# Patient Record
Sex: Male | Born: 1985 | Race: Black or African American | Hispanic: No | Marital: Married | Smoking: Current every day smoker
Health system: Southern US, Community
[De-identification: ages and names within clinical notes are randomized; demographics above are authoritative.]

## PROBLEM LIST (undated history)

## (undated) DIAGNOSIS — F419 Anxiety disorder, unspecified: Secondary | ICD-10-CM

## (undated) DIAGNOSIS — I10 Essential (primary) hypertension: Secondary | ICD-10-CM

## (undated) DIAGNOSIS — F32A Depression, unspecified: Secondary | ICD-10-CM

---

## 2005-06-07 ENCOUNTER — Ambulatory Visit: Payer: Self-pay | Admitting: Family Medicine

## 2005-06-09 ENCOUNTER — Ambulatory Visit: Payer: Self-pay | Admitting: Family Medicine

## 2005-06-15 ENCOUNTER — Emergency Department (HOSPITAL_COMMUNITY): Admission: EM | Admit: 2005-06-15 | Discharge: 2005-06-16 | Payer: Self-pay | Admitting: Emergency Medicine
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
## 2009-05-21 ENCOUNTER — Emergency Department (HOSPITAL_COMMUNITY): Admission: EM | Admit: 2009-05-21 | Discharge: 2009-05-21 | Payer: Self-pay | Admitting: Emergency Medicine

## 2018-12-02 ENCOUNTER — Other Ambulatory Visit: Payer: Self-pay

## 2018-12-02 DIAGNOSIS — Z20822 Contact with and (suspected) exposure to covid-19: Secondary | ICD-10-CM

## 2018-12-04 LAB — NOVEL CORONAVIRUS, NAA: SARS-CoV-2, NAA: DETECTED — AB

## 2019-09-22 ENCOUNTER — Encounter (HOSPITAL_COMMUNITY): Payer: Self-pay | Admitting: Emergency Medicine

## 2019-09-22 ENCOUNTER — Other Ambulatory Visit: Payer: Self-pay

## 2019-09-22 ENCOUNTER — Emergency Department (HOSPITAL_COMMUNITY)
Admission: EM | Admit: 2019-09-22 | Discharge: 2019-09-22 | Disposition: A | Discharge status: Home / Self Care | Payer: Self-pay | Attending: Emergency Medicine | Admitting: Emergency Medicine

## 2019-09-22 DIAGNOSIS — I10 Essential (primary) hypertension: Secondary | ICD-10-CM | POA: Insufficient documentation

## 2019-09-22 DIAGNOSIS — F17210 Nicotine dependence, cigarettes, uncomplicated: Secondary | ICD-10-CM | POA: Insufficient documentation

## 2019-09-22 DIAGNOSIS — F1910 Other psychoactive substance abuse, uncomplicated: Secondary | ICD-10-CM | POA: Insufficient documentation

## 2019-09-22 DIAGNOSIS — Z79899 Other long term (current) drug therapy: Secondary | ICD-10-CM | POA: Insufficient documentation

## 2019-09-22 HISTORY — DX: Essential (primary) hypertension: I10

## 2019-09-22 LAB — COMPREHENSIVE METABOLIC PANEL
ALT: 20 U/L (ref 0–44)
AST: 18 U/L (ref 15–41)
Albumin: 4 g/dL (ref 3.5–5.0)
Alkaline Phosphatase: 38 U/L (ref 38–126)
Anion gap: 9 (ref 5–15)
BUN: 13 mg/dL (ref 6–20)
CO2: 27 mmol/L (ref 22–32)
Calcium: 9.3 mg/dL (ref 8.9–10.3)
Chloride: 104 mmol/L (ref 98–111)
Creatinine, Ser: 1.31 mg/dL — ABNORMAL HIGH (ref 0.61–1.24)
GFR calc Af Amer: >60 mL/min (ref >60)
GFR calc non Af Amer: >60 mL/min (ref >60)
Glucose, Bld: 124 mg/dL — ABNORMAL HIGH (ref 70–99)
Potassium: 3.2 mmol/L — ABNORMAL LOW (ref 3.5–5.1)
Sodium: 140 mmol/L (ref 135–145)
Total Bilirubin: 0.8 mg/dL (ref 0.3–1.2)
Total Protein: 7.2 g/dL (ref 6.5–8.1)

## 2019-09-22 LAB — CBC WITH DIFFERENTIAL/PLATELET
Abs Immature Granulocytes: 0.02 10*3/uL (ref 0.00–0.07)
Basophils Absolute: 0 10*3/uL (ref 0.0–0.1)
Basophils Relative: 0 %
Eosinophils Absolute: 0.4 10*3/uL (ref 0.0–0.5)
Eosinophils Relative: 4 %
HCT: 46.1 % (ref 39.0–52.0)
Hemoglobin: 14.9 g/dL (ref 13.0–17.0)
Immature Granulocytes: 0 %
Lymphocytes Relative: 31 %
Lymphs Abs: 2.6 10*3/uL (ref 0.7–4.0)
MCH: 31.5 pg (ref 26.0–34.0)
MCHC: 32.3 g/dL (ref 30.0–36.0)
MCV: 97.5 fL (ref 80.0–100.0)
Monocytes Absolute: 0.6 10*3/uL (ref 0.1–1.0)
Monocytes Relative: 8 %
Neutro Abs: 4.7 10*3/uL (ref 1.7–7.7)
Neutrophils Relative %: 57 %
Platelets: 228 10*3/uL (ref 150–400)
RBC: 4.73 MIL/uL (ref 4.22–5.81)
RDW: 11.8 % (ref 11.5–15.5)
WBC: 8.3 10*3/uL (ref 4.0–10.5)
nRBC: 0 % (ref 0.0–0.2)

## 2019-09-22 LAB — RAPID URINE DRUG SCREEN, HOSP PERFORMED
Amphetamines: POSITIVE — AB
Barbiturates: NOT DETECTED
Benzodiazepines: NOT DETECTED
Cocaine: POSITIVE — AB
Opiates: NOT DETECTED
Tetrahydrocannabinol: POSITIVE — AB

## 2019-09-22 LAB — LIPASE, BLOOD: Lipase: 56 U/L — ABNORMAL HIGH (ref 11–51)

## 2019-09-22 NOTE — ED Triage Notes (Signed)
Pt brought in y [employer] for detox from marijuana, cocaine and ectasy last use last night/early this morning.

## 2019-09-22 NOTE — Discharge Instructions (Addendum)
Recommend refraining from illicit substances that you currently are using.  If you develop nausea, vomiting, chest pain, or other new concerns symptom, recommend return to ER for reassessment.  If you have thoughts of hurting herself, hurting others, would additionally recommend return to ER for reassessment.

## 2019-09-22 NOTE — ED Provider Notes (Signed)
[facility]-EMERGENCY DEPT Provider Note   CSN: [id number] Arrival date & time: 09/22/19  1520     History Chief Complaint  Patient presents with  . detox    Lamontae Ricardo is a 34 y.o. male.  Presents to ER by request of his girlfriend for evaluation for assistance with detox.  Patient primarily uses cocaine, also has used marijuana and ecstasy.  Has been on cocaine bender over the past couple days, has very poor sleep.  Today he feels very sleepy, fatigued.  On initial assessment, patient denies any abdominal pain, nausea or vomiting.  Felt very hungry and ate large [employer] cheeseburger.  On reassessment, patient states that he is having a stomachache and felt mildly nauseous.  No vomiting.  Denies any SI, HI, AVH.  He is not interested in going through detox program, he is, desires to go home and sleep.  HPI     Past Medical History:  Diagnosis Date  . Hypertension     There are no problems to display for this patient.   History reviewed. No pertinent surgical history.     No family history on file.  Social History   Tobacco Use  . Smoking status: Current Every Day Smoker    Types: Cigarettes  . Smokeless tobacco: Never Used  Substance Use Topics  . Alcohol use: Not on file  . Drug use: Yes    Types: Marijuana, Cocaine    Comment: ectasy     Home Medications Prior to Admission medications   Not on File    Allergies    Patient has no known allergies.  Review of Systems   Review of Systems  Constitutional: Negative for chills and fever.  HENT: Negative for ear pain and sore throat.   Eyes: Negative for pain and visual disturbance.  Respiratory: Negative for cough and shortness of breath.   Cardiovascular: Negative for chest pain and palpitations.  Gastrointestinal: Positive for nausea. Negative for abdominal pain and vomiting.  Genitourinary: Negative for dysuria and hematuria.  Musculoskeletal: Negative for arthralgias and back  pain.  Skin: Negative for color change and rash.  Neurological: Negative for seizures and syncope.  Psychiatric/Behavioral: Positive for sleep disturbance. Negative for self-injury. The patient is not nervous/anxious.   All other systems reviewed and are negative.   Physical Exam Updated Vital Signs BP (!) 156/104 (BP Location: Right Arm)   Pulse 83   Temp 98.8 F (37.1 C) (Oral)   Resp 16   SpO2 100%   Physical Exam Vitals and nursing note reviewed.  Constitutional:      Appearance: He is well-developed.     Comments: Mildly sleepy but not lethargic he is readily alert and answers questions appropriately  HENT:     Head: Normocephalic and atraumatic.  Eyes:     Conjunctiva/sclera: Conjunctivae normal.  Cardiovascular:     Rate and Rhythm: Normal rate and regular rhythm.     Heart sounds: No murmur.  Pulmonary:     Effort: Pulmonary effort is normal. No respiratory distress.     Breath sounds: Normal breath sounds.  Abdominal:     Palpations: Abdomen is soft.     Tenderness: There is no abdominal tenderness.  Musculoskeletal:        General: No deformity or signs of injury.     Cervical back: Neck supple.  Skin:    General: Skin is warm and dry.  Neurological:     General: No focal deficit present.  Mental Status: He is alert. Mental status is at baseline.  Psychiatric:     Comments: No SI or HI     ED Results / Procedures / Treatments   Labs (all labs ordered are listed, but only abnormal results are displayed) Labs Reviewed  RAPID URINE DRUG SCREEN, HOSP PERFORMED - Abnormal; Notable for the following components:      Result Value   Cocaine POSITIVE (*)    Amphetamines POSITIVE (*)    Tetrahydrocannabinol POSITIVE (*)    All other components within normal limits  COMPREHENSIVE METABOLIC PANEL - Abnormal; Notable for the following components:   Potassium 3.2 (*)    Glucose, Bld 124 (*)    Creatinine, Ser 1.31 (*)    All other components within normal  limits  LIPASE, BLOOD - Abnormal; Notable for the following components:   Lipase 56 (*)    All other components within normal limits  CBC WITH DIFFERENTIAL/PLATELET    EKG None  Radiology No results found.  Procedures Procedures (including critical care time)  Medications Ordered in ED Medications - No data to display  ED Course  I have reviewed the triage vital signs and the nursing notes.  Pertinent labs & imaging results that were available during my care of the patient were reviewed by me and considered in my medical decision making (see chart for details).    MDM Rules/Calculators/A&P                      34 year old male came to ER at request of his girlfriend after he went on a recent cocaine bender.  In ER today, patient appears well, mildly sleepy but not lethargic.  His vital signs are stable..  Patient initially denied any GI symptoms and was eating [employer], later he reported stomach ache. On re-exam, abdomen remained soft without tenderness. Got basic labs - mild bump in Cr, slight hypoK, suspect dehydration, normal LFTs, no WBC elevation. Pt tolerating PO without difficulty.  Initially, felt like patient was interested in seeking help for detox however when further pressed, patient reports he does not have any interest in this going to any sort of detox or rehab program for his cocaine abuse.  He only desires to go home to catch up on sleep.  Given patient is demonstrating any SI or HI, hemodynamically stable with benign exam, will discharge home with his girlfriend.    After the discussed management above, the patient was determined to be safe for discharge.  The patient was in agreement with this plan and all questions regarding their care were answered.  ED return precautions were discussed and the patient will return to the ED with any significant worsening of condition.    Final Clinical Impression(s) / ED Diagnoses Final diagnoses:  Polysubstance abuse [facility])     Rx / DC Orders ED Discharge Orders    None       Lucrezia Starch, MD 09/23/19 1551

## 2019-09-22 NOTE — Patient Outreach (Signed)
[facility] met with Pt an made attempt to process with Pt to gain an understanding on what services Pt is seeking, [facility] was made aware that he wants help but he wants to sleep right now. Pt addressed the fact that he has been up for 4 days partying an using. Pt mentioned that he just needs to lay down for a few days. [facility]PSS left contact information so that that Pt can follow up in community when ready for change.

## 2020-08-23 ENCOUNTER — Ambulatory Visit
Admission: EM | Admit: 2020-08-23 | Discharge: 2020-08-23 | Disposition: A | Discharge status: Home / Self Care | Payer: Self-pay | Attending: Family Medicine | Admitting: Family Medicine

## 2020-08-23 ENCOUNTER — Other Ambulatory Visit: Payer: Self-pay

## 2020-08-23 DIAGNOSIS — Z202 Contact with and (suspected) exposure to infections with a predominantly sexual mode of transmission: Secondary | ICD-10-CM | POA: Insufficient documentation

## 2020-08-23 MED ORDER — METRONIDAZOLE 500 MG PO TABS: 2000.0000 mg | ORAL_TABLET | Freq: Once | ORAL | 0 refills | Status: AC

## 2020-08-23 NOTE — ED Triage Notes (Signed)
Pt states his partner tested positive for trich

## 2020-08-26 LAB — CYTOLOGY, (ORAL, ANAL, URETHRAL) ANCILLARY ONLY
Chlamydia: NEGATIVE
Comment: NEGATIVE
Comment: NEGATIVE
Comment: NORMAL
Neisseria Gonorrhea: NEGATIVE
Trichomonas: NEGATIVE

## 2020-09-12 ENCOUNTER — Other Ambulatory Visit: Payer: Self-pay

## 2020-09-12 ENCOUNTER — Ambulatory Visit (HOSPITAL_COMMUNITY)
Admission: EM | Admit: 2020-09-12 | Discharge: 2020-09-13 | Disposition: A | Discharge status: Other Acute Inpatient Hospital | Payer: No Payment, Other | Attending: Urology | Admitting: Urology

## 2020-09-12 DIAGNOSIS — F1910 Other psychoactive substance abuse, uncomplicated: Secondary | ICD-10-CM | POA: Insufficient documentation

## 2020-09-12 DIAGNOSIS — F332 Major depressive disorder, recurrent severe without psychotic features: Secondary | ICD-10-CM | POA: Insufficient documentation

## 2020-09-12 DIAGNOSIS — Z9151 Personal history of suicidal behavior: Secondary | ICD-10-CM | POA: Insufficient documentation

## 2020-09-12 DIAGNOSIS — Z20822 Contact with and (suspected) exposure to covid-19: Secondary | ICD-10-CM | POA: Diagnosis not present

## 2020-09-12 DIAGNOSIS — R45851 Suicidal ideations: Secondary | ICD-10-CM | POA: Diagnosis not present

## 2020-09-12 DIAGNOSIS — F322 Major depressive disorder, single episode, severe without psychotic features: Secondary | ICD-10-CM

## 2020-09-12 LAB — RESP PANEL BY RT-PCR (FLU A&B, COVID) ARPGX2
Influenza A by PCR: NEGATIVE
Influenza B by PCR: NEGATIVE
SARS Coronavirus 2 by RT PCR: NEGATIVE

## 2020-09-12 LAB — POCT URINE DRUG SCREEN - MANUAL ENTRY (I-SCREEN)
POC Amphetamine UR: NOT DETECTED
POC Buprenorphine (BUP): NOT DETECTED
POC Cocaine UR: POSITIVE — AB
POC Marijuana UR: POSITIVE — AB
POC Methadone UR: NOT DETECTED
POC Methamphetamine UR: NOT DETECTED
POC Morphine: NOT DETECTED
POC Oxazepam (BZO): NOT DETECTED
POC Oxycodone UR: NOT DETECTED
POC Secobarbital (BAR): NOT DETECTED

## 2020-09-12 LAB — POC SARS CORONAVIRUS 2 AG: SARSCOV2ONAVIRUS 2 AG: NEGATIVE

## 2020-09-12 MED ORDER — HYDROXYZINE HCL 25 MG PO TABS: 25.0000 mg | ORAL_TABLET | Freq: Three times a day (TID) | ORAL | Status: DC | PRN

## 2020-09-12 MED ORDER — ACETAMINOPHEN 325 MG PO TABS: 650.0000 mg | ORAL_TABLET | Freq: Four times a day (QID) | ORAL | Status: DC | PRN

## 2020-09-12 MED ORDER — MAGNESIUM HYDROXIDE 400 MG/5ML PO SUSP: 30.0000 mL | Freq: Every day | ORAL | Status: DC | PRN

## 2020-09-12 MED ORDER — ALUM & MAG HYDROXIDE-SIMETH 200-200-20 MG/5ML PO SUSP: 30.0000 mL | ORAL | Status: DC | PRN

## 2020-09-12 MED ORDER — TRAZODONE HCL 50 MG PO TABS: 50.0000 mg | ORAL_TABLET | Freq: Every evening | ORAL | Status: DC | PRN

## 2020-09-12 NOTE — ED Notes (Addendum)
Pt admitted to continuous assessment due to SI. IVC paperwork in place states, "[name] tried to hang himself on a tree while live streaming on Facebook live. [name] abuses crack cocaine." Pt A&O x4, calm and cooperative. Pt tolerated lab work and skin assessment well. RN unable to obtain blood samples and pt requested to not try again tonight. Cecilio Asper, NP made aware. Pt ambulated independently to unit. Oriented to unit/staff. Meal given. No signs of acute distress noted. Will continue to monitor for safety.

## 2020-09-12 NOTE — ED Notes (Signed)
Pt asleep in bed. Respirations even and unlabored. Will continue to monitor for safety. ?

## 2020-09-12 NOTE — BH Assessment (Addendum)
Comprehensive Clinical Assessment (CCA) Note  09/12/2020 Julian Clarke [id number]  Recommendations for Services/Supports/Treatments: Cecilio Asper, NP, reviewed pt's chart and information and the IVC paperwork and determined pt meets inpatient criteria.  The patient demonstrates the following risk factors for suicide: Chronic risk factors for suicide include: psychiatric disorder of MDD, substance use disorder and demographic factors (male, >35 y/o). Acute risk factors for suicide include: family or marital conflict and loss (financial, interpersonal, professional). Protective factors for this patient include: positive social support and responsibility to others (children, family). Considering these factors, the overall suicide risk at this point appears to be high. Patient is not appropriate for outpatient follow up.  Therefore, a 1:1 sitter is recommended for suicide prevention.  Flowsheet Row ED from 09/12/2020 in [facility] ED from 08/23/2020 in [facility]   C-SSRS RISK CATEGORY High Risk No Risk     Chief Complaint:  Chief Complaint  Patient presents with  . Urgent Emergent Eval IVC   Visit Diagnosis: F33.2, Major depressive disorder, Recurrent episode, Severe; F14.24, Cocaine-induced depressive disorder, With moderate use disorder  CCA Screening, Triage and Referral (STR) Julian Clarke is a 36 year old patient who was brought to the [facility] by [facility] due to pt getting on live social media and saying "good-byes," as he was going to hang himself. After pt was brought to the [facility], he was [facility] by the police. The IVC states:  "[name] tried to hang himself on a tree while live-streaming it on [employer]. [name] abuses crack cocaine."  Pt acknowledges he attempted to kill himself this evening, stating he has been using crack-cocaine for "3 days non-stop." Pt states he's had sobriety in the past  and was 3 months sober until several weeks ago when he relapsed. Pt states he uses crack-cocaine, Ecstacy, marijuana, and EtOH.  Pt acknowledges current and prior SI; he denies he has a plan to kill himself at this time but acknowledges he attempted to kill himself today and in the past. Pt states he's been hospitalized in the past for mental health concerns but no such hospitalizations could be found in pt's record. Pt denies HI, AVH (with the exception of when he's using drugs), NSSIB, access to guns/weapons. He states he has upcoming court dates for vehicular charges, such as driving without a license, driving under a suspended license, etc.  Pt shares he is under a lot of stress at this time, including having a custody battle and having one of his children and his best friend die within several days of each other in April 2019. Pt shares he's also lost a younger sister to cancer and that his father is deceased. Pt shares he has a good relationship with his maternal grandparents and that he speaks to them daily. Pt states he also has a good relationship with his mother and that he speaks to her daily; pt shares his mother has a hx of SA and has been to treatment in the past.  Pt is oriented x5. His recent/remote memory is intact. Pt was cooperative throughout the assessment process. Pt's insight, judgement, and impulse control is impaired at this time.   Patient Reported Information How did you hear about [place]? Legal System  Referral name: LEO  Referral phone number: 0 (N/A)   Whom do you see for routine medical problems? No data recorded Practice/Facility Name: No data recorded Practice/Facility Phone Number: No data recorded Name of Contact: No data recorded  Contact Number: No data recorded Contact Fax Number: No data recorded Prescriber Name: No data recorded Prescriber Address (if known): No data recorded  What Is the Reason for Your Visit/Call Today? Pt was IVCed by the police after  he attempted to hang himself from a tree. Pt was attempting to kill himself on FB Live.  How Long Has This Been Causing You Problems? > than 6 months  What Do You Feel Would Help You the Most Today? Alcohol or Drug Use Treatment; Treatment for Depression or other mood problem   Have You Recently Been in Any Inpatient Treatment (Hospital/Detox/Crisis Center/28-Day Program)? No  Name/Location of Program/Hospital:No data recorded How Long Were You There? No data recorded When Were You Discharged? No data recorded  Have You Ever Received Services From [facility] Before? Yes  Who [facility]? Pt has been seen in the ED/Urgent Care several times   Have You Recently Had Any Thoughts About Hurting Yourself? Yes  Are You Planning to Commit Suicide/Harm Yourself At This time? No (However, pt was attempting to kill himself prior to [facility])   Have you Recently Had Thoughts About Hurting Someone Karolee Ohslse? No  Explanation: No data recorded  Have You Used Any Alcohol or Drugs in the Past 24 Hours? Yes  How Long Ago Did You Use Drugs or Alcohol? No data recorded What Did You Use and How Much? Pt was using crack-cocaine non-stop for the last 3 days.   Do You Currently Have a Therapist/Psychiatrist? No  Name of Therapist/Psychiatrist: No data recorded  Have You Been Recently Discharged From Any Office Practice or Programs? No  Explanation of Discharge From Practice/Program: No data recorded    CCA Screening Triage Referral Assessment Type of Contact: Face-to-Face  Is this Initial or Reassessment? No data recorded Date Telepsych consult ordered in [facility]:  No data recorded Time Telepsych consult ordered in [facility]:  No data recorded  Patient Reported Information Reviewed? Yes  Patient Left Without Being Seen? No data recorded Reason for Not Completing Assessment: No data recorded  Collateral Involvement: Pt declined to provide verbal consent at this time   Does  Patient Have a Court Appointed Legal Guardian? No data recorded Name and Contact of Legal Guardian: No data recorded If Minor and Not Living with Parent(s), Who has Custody? N/A  Is CPS involved or ever been involved? Never  Is APS involved or ever been involved? Never   Patient Determined To Be At Risk for Harm To Self or Others Based on Review of Patient Reported Information or Presenting Complaint? Yes, for Self-Harm  Method: No data recorded Availability of Means: No data recorded Intent: No data recorded Notification Required: No data recorded Additional Information for Danger to Others Potential: No data recorded Additional Comments for Danger to Others Potential: No data recorded Are There Guns or Other Weapons in Your Home? No data recorded Types of Guns/Weapons: No data recorded Are These Weapons Safely Secured?                            No data recorded Who Could Verify You Are Able To Have These Secured: No data recorded Do You Have any Outstanding Charges, Pending Court Dates, Parole/Probation? No data recorded Contacted To Inform of Risk of Harm To Self or Others: Patent examinerLaw Enforcement; Family/Significant Other: (Pt's family/friends and the police are aware of pt's potential harm to self.)   Location of Assessment:  [facility]   Does Patient Present under Involuntary Commitment? Yes  IVC Papers Initial File Date: 09/12/2020   [place] of Residence: [place]   Patient Currently Receiving the Following Services: Not Receiving Services   Determination of Need: Emergent (2 hours)   Options For Referral: Inpatient Hospitalization; Medication Management; Outpatient Therapy     CCA Biopsychosocial Intake/Chief Complaint:  Pt was IVCed by the police after he attempted to hang himself from a tree. Pt was attempting to kill himself on FB Live.  Current Symptoms/Problems: Pt has been using crack-cocaine and was experiencing SI with a plan   Patient  Reported Schizophrenia/Schizoaffective Diagnosis in Past: No   Strengths: Not assessed  Preferences: Not assessed  Abilities: Not assessed   Type of Services Patient Feels are Needed: Pt believes he most likely needs inpatient treatment   Initial Clinical Notes/Concerns: N/A   Mental Health Symptoms Depression:  Hopelessness; Difficulty Concentrating; Change in energy/activity   Duration of Depressive symptoms: Greater than two weeks   Mania:  None   Anxiety:   Worrying   Psychosis:  None   Duration of Psychotic symptoms: No data recorded  Trauma:  None   Obsessions:  None   Compulsions:  None   Inattention:  None   Hyperactivity/Impulsivity:  N/A   Oppositional/Defiant Behaviors:  None   Emotional Irregularity:  Potentially harmful impulsivity; Recurrent suicidal behaviors/gestures/threats   Other Mood/Personality Symptoms:  None noted    Mental Status Exam Appearance and self-care  Stature:  Average   Weight:  Overweight   Clothing:  Casual   Grooming:  Normal   Cosmetic use:  None   Posture/gait:  Normal   Motor activity:  Not Remarkable   Sensorium  Attention:  Normal   Concentration:  Normal   Orientation:  X5   Recall/memory:  Normal   Affect and Mood  Affect:  Appropriate   Mood:  Depressed   Relating  Eye contact:  Normal   Facial expression:  Responsive   Attitude toward examiner:  Cooperative   Thought and Language  Speech flow: Clear and Coherent   Thought content:  Appropriate to Mood and Circumstances   Preoccupation:  None   Hallucinations:  None (Pt states he only experiences AVH when he is using drugs)   Organization:  No data recorded  [employer] of Knowledge:  Average   Intelligence:  Average   Abstraction:  Normal   Judgement:  Poor   Reality Testing:  Realistic   Insight:  Poor   Decision Making:  Impulsive   Social Functioning  Social Maturity:  Impulsive   Social  Judgement:  Naive   Stress  Stressors:  Family conflict   Coping Ability:  Exhausted; Overwhelmed   Skill Deficits:  Decision making; Interpersonal; Self-control   Supports:  Family; Friends/Service system     Religion: Religion/Spirituality Are You A Religious Person?:  (Not assessed) How Might This Affect Treatment?: Not assessed  Leisure/Recreation: Leisure / Recreation Do You Have Hobbies?:  (Not assessed)  Exercise/Diet: Exercise/Diet Do You Exercise?:  (Not assessed) Have You Gained or Lost A Significant Amount of Weight in the Past Six Months?:  (Not assessed) Do You Follow a Special Diet?:  (Not assessed) Do You Have Any Trouble Sleeping?:  (Not assessed)   CCA Employment/Education Employment/Work Situation: Employment / Work Situation Employment situation: Employed Where is patient currently employed?: [employer] How long has patient been employed?: Not assessed Patient's job has been impacted by current illness:  (  Not assessed) What is the longest time patient has a held a job?: Not assessed Where was the patient employed at that time?: Not assessed Has patient ever been in the [employer]?:  (Not assessed)  Education: Education Is Patient Currently Attending School?: No Last Grade Completed:  (Not assessed) Name of High School: Not assessed Did Garment/textile technologist From [employer]?:  (Not assessed) Did You Attend College?:  (Not assessed) Did You Attend Graduate School?:  (Not assessed) Did You Have Any Special Interests In School?: Not assessed Did You Have An Individualized Education Program (IIEP):  (Not assessed) Did You Have Any Difficulty At School?:  (Not assessed) Patient's Education Has Been Impacted by Current Illness:  (Not assessed)   CCA Family/Childhood History Family and Relationship History: Family history Marital status:  (Not assessed) Are you sexually active?:  (Not assessed) What is your sexual orientation?: Not assessed Has your sexual  activity been affected by drugs, alcohol, medication, or emotional stress?: Not assessed Does patient have children?: Yes How many children?: 7 How is patient's relationship with their children?: Pt has 7 children with 5 different women; his involvement with each of them is different. One child died at age 43 (in 2017/08/27) after being hit by a car.  Childhood History:  Childhood History By whom was/is the patient raised?: Grandparents Additional childhood history information: Pt's mother has a hx of SA Description of patient's relationship with caregiver when they were a child: Not assessed Patient's description of current relationship with people who raised him/her: Pt has a strong relationship with his mother and his maternal grandparents. How were you disciplined when you got in trouble as a child/adolescent?: Not assessed Does patient have siblings?: Yes Number of Siblings: 3 Description of patient's current relationship with siblings: 1 sister (younger) died from cancer; 1 sister (older) he doesn't speak to; his brother (older) experienced a loss of oxygen and is special needs. Did patient suffer any verbal/emotional/physical/sexual abuse as a child?: No Did patient suffer from severe childhood neglect?: No Has patient ever been sexually abused/assaulted/raped as an adolescent or adult?: No Was the patient ever a victim of a crime or a disaster?: No Witnessed domestic violence?: No Has patient been affected by domestic violence as an adult?: No  Child/Adolescent Assessment:     CCA Substance Use Alcohol/Drug Use: Alcohol / Drug Use Pain Medications: See MAR Prescriptions: See MAR Over the Counter: See MAR History of alcohol / drug use?: Yes Longest period of sobriety (when/how long): Pt was 3 months sober until several months ago Negative Consequences of Use: Financial,Personal relationships Withdrawal Symptoms:  (Pt denies) Substance #1 Name of Substance 1: Crack  cocaine 1 - Age of First Use: 3 years ago 1 - Amount (size/oz): Unknown; pt states he smoked for 3 days non-stop 1 - Frequency: Varies 1 - Duration: Varies 1 - Last Use / Amount: 12 hours ago 1 - Method of Aquiring: Purchase 1- Route of Use: Smoke Substance #2 Name of Substance 2: Ecstacy 2 - Age of First Use: 23 2 - Amount (size/oz): 5 pills simultaneously 2 - Frequency: Occasionally 2 - Duration: N/A 2 - Last Use / Amount: 1 week ago 2 - Method of Aquiring: Purchase 2 - Route of Substance Use: Oral Substance #3 Name of Substance 3: Marijuana 3 - Age of First Use: 19 3 - Amount (size/oz): 3 1/2 grams 3 - Frequency: Every-other week 3 - Duration: N/A 3 - Last Use / Amount: Friday, August 11, 2020 3 -  Method of Aquiring: Purchase 3 - Route of Substance Use: Smoke Substance #4 Name of Substance 4: EtOH 4 - Age of First Use: 17 4 - Amount (size/oz): A "bootlegger" 4 - Frequency: Occasional 4 - Duration: N/A 4 - Last Use / Amount: Today 4 - Method of Aquiring: Purchase 4 - Route of Substance Use: Oral                 ASAM's:  Six Dimensions of Multidimensional Assessment  Dimension 1:  Acute Intoxication and/or Withdrawal Potential:   Dimension 1:  Description of individual's past and current experiences of substance use and withdrawal: Pt denies w/d symptoms  Dimension 2:  Biomedical Conditions and Complications:   Dimension 2:  Description of patient's biomedical conditions and  complications: Pt denies medical complications  Dimension 3:  Emotional, Behavioral, or Cognitive Conditions and Complications:  Dimension 3:  Description of emotional, behavioral, or cognitive conditions and complications: Pt attempted to kill himself today  Dimension 4:  Readiness to Change:  Dimension 4:  Description of Readiness to Change criteria: Pt acknowledges he needs to stop using substances  Dimension 5:  Relapse, Continued use, or Continued Problem Potential:  Dimension 5:  Relapse,  continued use, or continued problem potential critiera description: Pt has a hx of relapse  Dimension 6:  Recovery/Living Environment:  Dimension 6:  Recovery/Iiving environment criteria description: Pt lives alone  ASAM Severity Score: ASAM's Severity Rating Score: 12  ASAM Recommended Level of Treatment: ASAM Recommended Level of Treatment: Level II Intensive Outpatient Treatment   Substance use Disorder (SUD) Substance Use Disorder (SUD)  Checklist Symptoms of Substance Use: Persistent desire or unsuccessful efforts to cut down or control use,Substance(s) often taken in larger amounts or over longer times than was intended,Evidence of tolerance  Recommendations for Services/Supports/Treatments: Recommendations for Services/Supports/Treatments Recommendations For Services/Supports/Treatments: Inpatient Hospitalization,Individual Therapy,Medication Management  Cecilio Asper, NP, reviewed pt's chart and information and the IVC paperwork and determined pt meets inpatient criteria.  DSM5 Diagnoses: F33.2, Major depressive disorder, Recurrent episode, Severe; F14.24, Cocaine-induced depressive disorder, With moderate use disorder  Patient Centered Plan: Patient is on the following Treatment Plan(s):  Depression and Substance Abuse   Referrals to Alternative Service(s): Referred to Alternative Service(s):   Place:   Date:   Time:    Referred to Alternative Service(s):   Place:   Date:   Time:    Referred to Alternative Service(s):   Place:   Date:   Time:    Referred to Alternative Service(s):   Place:   Date:   Time:     Ralph Dowdy, LMFT

## 2020-09-12 NOTE — ED Provider Notes (Signed)
Behavioral Health Admission [facility]&P [facility] & OBS)  Date: 09/13/20 Patient Name: Julian Clarke MRN: [id number] Chief Complaint:  Chief Complaint  Patient presents with  . IVC  . Suicidal   Chief Complaint/Presenting Problem: Pt was IVCed by the police after he attempted to hang himself from a tree. Pt was attempting to kill himself on FB Live.  Diagnoses:  Final diagnoses:  Polysubstance abuse (HCC)  Suicidal ideation  Current severe episode of major depressive disorder without psychotic features, unspecified whether recurrent (HCC)    VZD:GLOVFIEP Julian Clarke is a 35 y/o male. Patient presented to [facility] involuntarily via law enforcement under IVC paper. Per IVC paper, patient was on facebook live stream stating he wanted to kill himself.   Patient presented with chief complaint of suicidal attempt. Patient is assessed by this NP. Patient is alert and oriented X4, calm and cooperative, he answers assessment questions appropriately, his mood is depressed and affect is congruent with his mood. He denies acute illness, respiratory distress, chest pain/discomfort, GU/GI symptoms, or recent trauma   Patient expressed hopelessness and feeling down. He states "I want to quit life. I'm done struggling to stay sober." patient endorses using crack cocaine, ecstasy, cocaine, and marijuana. He report that he was sober for 3 months and relapsed a few weeks ago. He states "I've been binging on crack cocaine non-stop for 3 days."   He reports a prior suicidal attempt last year by "drinking a bottle of Nyquil." He report that he felt sick afterwards and vomited the medication, he did not seek medication attention for this suicide attempt. He report prior inpatient psychiatric treatment at [facility] however on record of this admission was formed in Epic. Per chart review, he was evaluated for polysubstance abuse on 09/22/19 and discharged home because "he was not interested in going through a detox program."  Patient report  that he was suicidal earlier today; he states "I made a noose and hung it a tree in the backyard and attempted to hang myself today." He denies current suicidal ideation, he is also denying HI, paranoia, AVH (states he only has have hallucination when using drugs), and no delusions noted.   He reports that his main stressors are custody issues for his children, struggling with sobriety, and legal matters with his driving records. He is employed and works as a Conservation officer, nature at [employer], he lives at home alone. He has regular contact with his mother and maternal grandparents     PHQ 2-9:   Flowsheet Row ED from 09/12/2020 in [facility] ED from 08/23/2020 in [facility]   C-SSRS RISK CATEGORY High Risk No Risk       Total Time spent with patient: 30 minutes  Musculoskeletal  Strength & Muscle Tone: within normal limits Gait & Station: normal Patient leans: Right  Psychiatric Specialty Exam  Presentation General Appearance: Appropriate for Environment  Eye Contact:Good  Speech:Clear and Coherent  Speech Volume:Normal  Handedness:Right   Mood and Affect  Mood:Depressed  Affect:Congruent; Depressed   Thought Process  Thought Processes:Coherent  Descriptions of Associations:Intact  Orientation:Full (Time, Place and Person)  Thought Content:WDL    Hallucinations:Hallucinations: None  Ideas of Reference:None  Suicidal Thoughts:Suicidal Thoughts: Yes, Active SI Active Intent and/or Plan: With Plan; With Intent  Homicidal Thoughts:Homicidal Thoughts: No   Sensorium  Memory:Immediate Good; Recent Good; Remote Good  Judgment:Fair  Insight:Good   Executive Functions  Concentration:Good  Attention Span:Good  Recall:Good  Fund of Knowledge:Good  Language:Good  Psychomotor Activity  Psychomotor Activity:Psychomotor Activity: Normal   Assets  Assets:Communication Skills; Desire for Improvement; Housing;  Social Support; Physical Health; Vocational/Educational   Sleep  Sleep:Sleep: Fair   Nutritional Assessment (For [facility] only) Has the patient had a weight loss or gain of 10 pounds or more in the last 3 months?: No Has the patient had a decrease in food intake/or appetite?: No Does the patient have dental problems?: No Does the patient have eating habits or behaviors that may be indicators of an eating disorder including binging or inducing vomiting?: No Has the patient recently lost weight without trying?: No Has the patient been eating poorly because of a decreased appetite?: No Malnutrition Screening Tool Score: 0    Physical Exam Vitals and nursing note reviewed.  Constitutional:      Appearance: He is well-developed.  HENT:     Head: Normocephalic and atraumatic.  Eyes:     Conjunctiva/sclera: Conjunctivae normal.  Cardiovascular:     Rate and Rhythm: Normal rate.     Heart sounds: No murmur heard.   Pulmonary:     Effort: Pulmonary effort is normal. No respiratory distress.  Abdominal:     Tenderness: There is no abdominal tenderness.  Musculoskeletal:     Cervical back: Neck supple.  Skin:    General: Skin is warm and dry.  Neurological:     Mental Status: He is alert and oriented to person, place, and time.  Psychiatric:        Attention and Perception: Attention normal. He is attentive. He does not perceive auditory or visual hallucinations.        Mood and Affect: Mood normal.        Speech: Speech normal.        Behavior: Behavior normal. Behavior is cooperative.        Thought Content: Thought content is not paranoid or delusional. Thought content does not include homicidal or suicidal ideation. Thought content does not include homicidal or suicidal plan.        Cognition and Memory: Cognition normal.        Judgment: Judgment normal.    Review of Systems  Constitutional: Negative.  Negative for chills and fever.  HENT: Negative.    Eyes: Negative.   Respiratory: Negative.   Cardiovascular: Negative.  Negative for chest pain and palpitations.  Gastrointestinal: Negative.  Negative for abdominal pain, nausea and vomiting.  Genitourinary: Negative.   Musculoskeletal: Negative.   Skin: Negative.   Neurological: Negative.   Endo/Heme/Allergies: Negative.   Psychiatric/Behavioral: Positive for depression and substance abuse. Negative for hallucinations. The patient is nervous/anxious.     Blood pressure 133/86, pulse (!) 107, temperature 98.6 F (37 C), temperature source Oral, resp. rate 18, SpO2 96 %. There is no height or weight on file to calculate BMI.  Past Psychiatric History: Polysubstance abuse    Is the patient at risk to self? No  Has the patient been a risk to self in the past 6 months? Yes .    Has the patient been a risk to self within the distant past? Yes   Is the patient a risk to others? No   Has the patient been a risk to others in the past 6 months? No   Has the patient been a risk to others within the distant past? No   Past Medical History:  Past Medical History:  Diagnosis Date  . Hypertension    No past surgical history on file.  Family History:  Family History  Family history unknown: Yes    Social History:  Social History   Socioeconomic History  . Marital status: Married    Spouse name: Not on file  . Number of children: Not on file  . Years of education: Not on file  . Highest education level: Not on file  Occupational History  . Not on file  Tobacco Use  . Smoking status: Current Every Day Smoker    Types: Cigarettes  . Smokeless tobacco: Never Used  Vaping Use  . Vaping Use: Every day  Substance and Sexual Activity  . Alcohol use: Not on file  . Drug use: Yes    Types: Marijuana, Cocaine    Comment: ectasy   . Sexual activity: Not on file  Other Topics Concern  . Not on file  Social History Narrative  . Not on file   Social Determinants of Health    Financial Resource Strain: Not on file  Food Insecurity: Not on file  Transportation Needs: Not on file  Physical Activity: Not on file  Stress: Not on file  Social Connections: Not on file  Intimate Partner Violence: Not on file    SDOH:  SDOH Screenings   Alcohol Screen: Not on file  Depression (NWG9-5): Not on file  Financial Resource Strain: Not on file  Food Insecurity: Not on file  Housing: Not on file  Physical Activity: Not on file  Social Connections: Not on file  Stress: Not on file  Tobacco Use: High Risk  . Smoking Tobacco Use: Current Every Day Smoker  . Smokeless Tobacco Use: Never Used  Transportation Needs: Not on file    Last Labs:  Admission on 09/12/2020  Component Date Value Ref Range Status  . SARS Coronavirus 2 by RT PCR 09/12/2020 NEGATIVE  NEGATIVE Final   Comment: (NOTE) SARS-CoV-2 target nucleic acids are NOT DETECTED.  The SARS-CoV-2 RNA is generally detectable in upper respiratory specimens during the acute phase of infection. The lowest concentration of SARS-CoV-2 viral copies this assay can detect is 138 copies/mL. A negative result does not preclude SARS-Cov-2 infection and should not be used as the sole basis for treatment or other patient management decisions. A negative result may occur with  improper specimen collection/handling, submission of specimen other than nasopharyngeal swab, presence of viral mutation(s) within the areas targeted by this assay, and inadequate number of viral copies(<138 copies/mL). A negative result must be combined with clinical observations, patient history, and epidemiological information. The expected result is Negative.  Fact Sheet for Patients:  BloggerCourse.com  Fact Sheet for Healthcare Providers:  SeriousBroker.it  This test is no                          t yet approved or cleared by the [place] FDA and  has been authorized for  detection and/or diagnosis of SARS-CoV-2 by FDA under an Emergency Use Authorization (EUA). This EUA will remain  in effect (meaning this test can be used) for the duration of the COVID-19 declaration under Section 564(b)(1) of the Act, 21 U.S.C.section 360bbb-3(b)(1), unless the authorization is terminated  or revoked sooner.      . Influenza A by PCR 09/12/2020 NEGATIVE  NEGATIVE Final  . Influenza B by PCR 09/12/2020 NEGATIVE  NEGATIVE Final   Comment: (NOTE) The Xpert Xpress SARS-CoV-2/FLU/RSV plus assay is intended as an aid in the diagnosis of influenza from Nasopharyngeal swab specimens and should not  be used as a sole basis for treatment. Nasal washings and aspirates are unacceptable for Xpert Xpress SARS-CoV-2/FLU/RSV testing.  Fact Sheet for Patients: BloggerCourse.com  Fact Sheet for Healthcare Providers: SeriousBroker.it  This test is not yet approved or cleared by the [place] FDA and has been authorized for detection and/or diagnosis of SARS-CoV-2 by FDA under an Emergency Use Authorization (EUA). This EUA will remain in effect (meaning this test can be used) for the duration of the COVID-19 declaration under Section 564(b)(1) of the Act, 21 U.S.C. section 360bbb-3(b)(1), unless the authorization is terminated or revoked.  Performed at [facility], [address] [place], [place] [address]   . POC Amphetamine UR 09/12/2020 None Detected  NONE DETECTED (Cut Off Level 1000 ng/mL) Final  . POC Secobarbital (BAR) 09/12/2020 None Detected  NONE DETECTED (Cut Off Level 300 ng/mL) Final  . POC Buprenorphine (BUP) 09/12/2020 None Detected  NONE DETECTED (Cut Off Level 10 ng/mL) Final  . POC Oxazepam (BZO) 09/12/2020 None Detected  NONE DETECTED (Cut Off Level 300 ng/mL) Final  . POC Cocaine UR 09/12/2020 Positive* NONE DETECTED (Cut Off Level 300 ng/mL) Final  . POC Methamphetamine UR 09/12/2020 None Detected   NONE DETECTED (Cut Off Level 1000 ng/mL) Final  . POC Morphine 09/12/2020 None Detected  NONE DETECTED (Cut Off Level 300 ng/mL) Final  . POC Oxycodone UR 09/12/2020 None Detected  NONE DETECTED (Cut Off Level 100 ng/mL) Final  . POC Methadone UR 09/12/2020 None Detected  NONE DETECTED (Cut Off Level 300 ng/mL) Final  . POC Marijuana UR 09/12/2020 Positive* NONE DETECTED (Cut Off Level 50 ng/mL) Final  . SARSCOV2ONAVIRUS 2 AG 09/12/2020 NEGATIVE  NEGATIVE Final   Comment: (NOTE) SARS-CoV-2 antigen NOT DETECTED.   Negative results are presumptive.  Negative results do not preclude SARS-CoV-2 infection and should not be used as the sole basis for treatment or other patient management decisions, including infection  control decisions, particularly in the presence of clinical signs and  symptoms consistent with COVID-19, or in those who have been in contact with the virus.  Negative results must be combined with clinical observations, patient history, and epidemiological information. The expected result is Negative.  Fact Sheet for Patients: https://www.jennings-kim.com/  Fact Sheet for Healthcare Providers: https://alexander-rogers.biz/  This test is not yet approved or cleared by the [place] FDA and  has been authorized for detection and/or diagnosis of SARS-CoV-2 by FDA under an Emergency Use Authorization (EUA).  This EUA will remain in effect (meaning this test can be used) for the duration of  the COV                          ID-19 declaration under Section 564(b)(1) of the Act, 21 U.S.C. section 360bbb-3(b)(1), unless the authorization is terminated or revoked sooner.    Admission on 08/23/2020, Discharged on 08/23/2020  Component Date Value Ref Range Status  . Trichomonas 08/23/2020 Negative   Final  . Chlamydia 08/23/2020 Negative   Final  . Neisseria Gonorrhea 08/23/2020 Negative   Final  . Comment 08/23/2020 Normal Reference Range Trichomonas  - Negative   Final  . Comment 08/23/2020 Normal Reference Ranger Chlamydia - Negative   Final  . Comment 08/23/2020 Normal Reference Range Neisseria Gonorrhea - Negative   Final    Allergies: Patient has no known allergies.  PTA Medications: (Not in a hospital admission)   Medical Decision Making  Recommend inpatient psychiatric treatment. Will admit patient  to [facility] for continuous assessment while awaiting inpatient bed availability.  -obtain labs  -    Recommendations  Based on my evaluation the patient does not appear to have an emergency medical condition.  Maricela Bo, NP 09/13/20  3:57 AM

## 2020-09-13 ENCOUNTER — Inpatient Hospital Stay (HOSPITAL_COMMUNITY)
Admission: AD | Admit: 2020-09-13 | Discharge: 2020-09-16 | DRG: 885 | Disposition: A | Discharge status: Home / Self Care | Payer: Federal, State, Local not specified - Other | Source: Intra-hospital | Attending: Emergency Medicine | Admitting: Emergency Medicine

## 2020-09-13 ENCOUNTER — Encounter (HOSPITAL_COMMUNITY): Payer: Self-pay | Admitting: Psychiatry

## 2020-09-13 ENCOUNTER — Other Ambulatory Visit: Payer: Self-pay

## 2020-09-13 DIAGNOSIS — I10 Essential (primary) hypertension: Secondary | ICD-10-CM | POA: Diagnosis present

## 2020-09-13 DIAGNOSIS — F909 Attention-deficit hyperactivity disorder, unspecified type: Secondary | ICD-10-CM | POA: Diagnosis present

## 2020-09-13 DIAGNOSIS — F17210 Nicotine dependence, cigarettes, uncomplicated: Secondary | ICD-10-CM | POA: Diagnosis present

## 2020-09-13 DIAGNOSIS — F1410 Cocaine abuse, uncomplicated: Secondary | ICD-10-CM | POA: Diagnosis not present

## 2020-09-13 DIAGNOSIS — F411 Generalized anxiety disorder: Secondary | ICD-10-CM | POA: Diagnosis present

## 2020-09-13 DIAGNOSIS — F1420 Cocaine dependence, uncomplicated: Secondary | ICD-10-CM | POA: Diagnosis present

## 2020-09-13 DIAGNOSIS — F1290 Cannabis use, unspecified, uncomplicated: Secondary | ICD-10-CM | POA: Diagnosis present

## 2020-09-13 DIAGNOSIS — Z9151 Personal history of suicidal behavior: Secondary | ICD-10-CM

## 2020-09-13 DIAGNOSIS — F332 Major depressive disorder, recurrent severe without psychotic features: Secondary | ICD-10-CM | POA: Diagnosis present

## 2020-09-13 DIAGNOSIS — F322 Major depressive disorder, single episode, severe without psychotic features: Secondary | ICD-10-CM | POA: Diagnosis present

## 2020-09-13 DIAGNOSIS — F32A Depression, unspecified: Secondary | ICD-10-CM

## 2020-09-13 DIAGNOSIS — R4589 Other symptoms and signs involving emotional state: Secondary | ICD-10-CM | POA: Diagnosis present

## 2020-09-13 HISTORY — DX: Depression, unspecified: F32.A

## 2020-09-13 HISTORY — DX: Anxiety disorder, unspecified: F41.9

## 2020-09-13 LAB — COMPREHENSIVE METABOLIC PANEL
ALT: 33 U/L (ref 0–44)
AST: 32 U/L (ref 15–41)
Albumin: 4 g/dL (ref 3.5–5.0)
Alkaline Phosphatase: 36 U/L — ABNORMAL LOW (ref 38–126)
Anion gap: 4 — ABNORMAL LOW (ref 5–15)
BUN: 17 mg/dL (ref 6–20)
CO2: 28 mmol/L (ref 22–32)
Calcium: 9.3 mg/dL (ref 8.9–10.3)
Chloride: 105 mmol/L (ref 98–111)
Creatinine, Ser: 1.21 mg/dL (ref 0.61–1.24)
GFR, Estimated: >60 mL/min (ref >60)
Glucose, Bld: 141 mg/dL — ABNORMAL HIGH (ref 70–99)
Potassium: 3.9 mmol/L (ref 3.5–5.1)
Sodium: 137 mmol/L (ref 135–145)
Total Bilirubin: 0.6 mg/dL (ref 0.3–1.2)
Total Protein: 7.3 g/dL (ref 6.5–8.1)

## 2020-09-13 LAB — CBC
HCT: 41.3 % (ref 39.0–52.0)
Hemoglobin: 13.7 g/dL (ref 13.0–17.0)
MCH: 31.6 pg (ref 26.0–34.0)
MCHC: 33.2 g/dL (ref 30.0–36.0)
MCV: 95.4 fL (ref 80.0–100.0)
Platelets: 242 10*3/uL (ref 150–400)
RBC: 4.33 MIL/uL (ref 4.22–5.81)
RDW: 11.8 % (ref 11.5–15.5)
WBC: 6.4 10*3/uL (ref 4.0–10.5)
nRBC: 0 % (ref 0.0–0.2)

## 2020-09-13 LAB — TSH: TSH: 2.17 u[IU]/mL (ref 0.350–4.500)

## 2020-09-13 MED ORDER — NICOTINE POLACRILEX 2 MG MT GUM
2.0000 mg | CHEWING_GUM | OROMUCOSAL | Status: DC | PRN
  Administered 2020-09-14: 2 mg via ORAL

## 2020-09-13 MED ORDER — ACETAMINOPHEN 325 MG PO TABS: 650.0000 mg | ORAL_TABLET | Freq: Four times a day (QID) | ORAL | Status: DC | PRN

## 2020-09-13 MED ORDER — HYDROXYZINE HCL 25 MG PO TABS
25.0000 mg | ORAL_TABLET | Freq: Three times a day (TID) | ORAL | Status: DC | PRN
  Administered 2020-09-13 – 2020-09-14 (×2): 25 mg via ORAL
  Filled 2020-09-13 (×2): qty 1

## 2020-09-13 MED ORDER — ALUM & MAG HYDROXIDE-SIMETH 200-200-20 MG/5ML PO SUSP: 30.0000 mL | ORAL | Status: DC | PRN

## 2020-09-13 MED ORDER — TRAZODONE HCL 50 MG PO TABS
50.0000 mg | ORAL_TABLET | Freq: Every evening | ORAL | Status: DC | PRN
  Administered 2020-09-13 – 2020-09-15 (×3): 50 mg via ORAL
  Filled 2020-09-13 (×2): qty 1
  Filled 2020-09-13: qty 7
  Filled 2020-09-13 (×2): qty 1

## 2020-09-13 MED ORDER — MAGNESIUM HYDROXIDE 400 MG/5ML PO SUSP: 30.0000 mL | Freq: Every day | ORAL | Status: DC | PRN

## 2020-09-13 NOTE — Tx Team (Signed)
Initial Treatment Plan 09/13/2020 7:13 PM Glori Bickers ZHG:[id number]    PATIENT STRESSORS: Financial difficulties Legal issue Marital or family conflict Occupational concerns Substance abuse Traumatic event   PATIENT STRENGTHS: Ability for insight Average or above average intelligence Capable of independent living Communication skills General fund of knowledge Motivation for treatment/growth Physical Health Work skills   PATIENT IDENTIFIED PROBLEMS: "job stress"  "substance abuse"  "suicidal thoughts"  "anxiety"  "depression"             DISCHARGE CRITERIA:  Ability to meet basic life and health needs Adequate post-discharge living arrangements Improved stabilization in mood, thinking, and/or behavior Motivation to continue treatment in a less acute level of care Need for constant or close observation no longer present Reduction of life-threatening or endangering symptoms to within safe limits Safe-care adequate arrangements made Verbal commitment to aftercare and medication compliance Withdrawal symptoms are absent or subacute and managed without 24-hour nursing intervention  PRELIMINARY DISCHARGE PLAN: Attend aftercare/continuing care group Attend PHP/IOP Attend 12-step recovery group Outpatient therapy Placement in alternative living arrangements Return to previous work or school arrangements  PATIENT/FAMILY INVOLVEMENT: This treatment plan has been presented to and reviewed with the patient, Jansel Vonstein.  The patient and family have been given the opportunity to ask questions and make suggestions.  Quintella Reichert [place], RN 09/13/2020, 7:13 PM

## 2020-09-13 NOTE — Progress Notes (Signed)
Patient is 35 yr old male, IVC by police.  Patient was on facebook, said goodbye to friends, said he was going to kill himself.  Family called police.  Plan to hang self from tree.  Using crack three days.  Relapsed on THC, ectasy, alcohol.  Tattoos on R lower arm, no scars, no skin problems. During admission patient stated he does feel SI, contracts for safety, no plan.  Has been in hospital in past.  Denied HI.  Denied A/V hallucinations.  Does use drugs, drinks alcohol.  Denied self injury.  Denied access to guns.  Court dates for driving no license, custody battle.  Was living with girlfriend and their two children.  Patient left and has been living in car.  May live with his grandparents after discharge from [facility].  Children are 5, 7, 71 yrs old and live with their mother.  Works at [employer].  No insurance.   Rated anxiety and hopeless 5, depression 10.  Denied SI, then admitted SI, no plan, contracts for safety.  Denied HI.  Denied A/V hallucinations.  Cigarettes since 35 yrs old, one pack daily.  THC since age of 35 yrs old, last used Saturday morning, every other month use.  Alcohol since age of 35 yrs old, occasional beer once every other week.  Snort cocaine since 35 yrs old, one gram - 3 grams every 2 days.  Smokes crack cocaine.  Does not abuse prescription drugs.  Does not take medications at home.   Rated depression 10, hopeless 5, has anxiety.  ADHD during childhood.  One yr college.  Main stress:  Job, drugs, money.   Fall risk information given to patient, low fall risk. Patient oriented to unit, given food/drink.

## 2020-09-13 NOTE — ED Notes (Signed)
Breakfast given.  

## 2020-09-13 NOTE — Discharge Instructions (Addendum)
Take all medications as prescribed. Keep all follow-up appointments as scheduled.  Do not consume alcohol or use illegal drugs while on prescription medications. Report any adverse effects from your medications to your primary care provider promptly.  In the event of recurrent symptoms or worsening symptoms, call 911, a crisis hotline, or go to the nearest emergency department for evaluation.   

## 2020-09-13 NOTE — ED Notes (Signed)
Ambulated per self to retrieve belongings. Escorted by security d/t IVC. Escorted to back sallyport and into custody of [employer] for transport to [facility]. Stable at time of d/c

## 2020-09-13 NOTE — ED Provider Notes (Addendum)
FBC/OBS ASAP Discharge Summary  Date and Time: 09/13/2020 1:23 PM  Name: Julian Clarke  MRN:  [id number]   Discharge Diagnoses:  Final diagnoses:  Polysubstance abuse [facility])  Suicidal ideation  Current severe episode of major depressive disorder without psychotic features, unspecified whether recurrent (HCC)    Julian Clarke was accepted to inpatient at [facility] 305.1 1st examination upheld.  Myron reported that he was feeling suicidal last night however is currently denying today and is requesting to discharge.  States he resides with his grandparents. " well... I just use their address" reported history with substance abuse to cocaine, pills and ecstasy.  Denied previous inpatient admissions.  Reports multiple stressors however did not elaborate during this assessment. " just have a lot going on."   Per admission assessment note: Julian Clarke is a 35 y/o male. Patient presented to [facility] involuntarily via law enforcement under IVC paper. Per IVC paper, patient was on facebook live stream stating he wanted to kill himself.   Patient presented with chief complaint of suicidal attempt. Patient is assessed by this NP. Patient is alert and oriented X4, calm and cooperative, he answers assessment questions appropriately, his mood is depressed and affect is congruent with his mood. He denies acute illness, respiratory distress, chest pain/discomfort, GU/GI symptoms, or recent trauma   Patient expressed hopelessness and feeling down. He states "I want to quit life. I'm done struggling to stay sober." patient endorses using crack cocaine, ecstasy, cocaine, and marijuana. He report that he was sober for 3 months and relapsed a few weeks ago. He states "I've been binging on crack cocaine non-stop for 3 days."   He reports a prior suicidal attempt last year by "drinking a bottle of Nyquil." He report that he felt sick afterwards and vomited the medication, he did not seek medication attention for this  suicide attempt. He report prior inpatient psychiatric treatment at [facility] however on record of this admission was formed in Epic. Per chart review, he was evaluated for polysubstance abuse on 09/22/19 and discharged home because "he was not interested in going through a detox program."   Total Time spent with patient: 15 minutes  Past Psychiatric History: Past Medical History:  Past Medical History:  Diagnosis Date  . Hypertension    No past surgical history on file. Family History:  Family History  Family history unknown: Yes   Family Psychiatric History:  Social History:  Social History   Substance and Sexual Activity  Alcohol Use None     Social History   Substance and Sexual Activity  Drug Use Yes  . Types: Marijuana, Cocaine   Comment: ectasy     Social History   Socioeconomic History  . Marital status: Married    Spouse name: Not on file  . Number of children: Not on file  . Years of education: Not on file  . Highest education level: Not on file  Occupational History  . Not on file  Tobacco Use  . Smoking status: Current Every Day Smoker    Types: Cigarettes  . Smokeless tobacco: Never Used  Vaping Use  . Vaping Use: Every day  Substance and Sexual Activity  . Alcohol use: Not on file  . Drug use: Yes    Types: Marijuana, Cocaine    Comment: ectasy   . Sexual activity: Not on file  Other Topics Concern  . Not on file  Social History Narrative  . Not on file   Social Determinants of Health  Financial Resource Strain: Not on file  Food Insecurity: Not on file  Transportation Needs: Not on file  Physical Activity: Not on file  Stress: Not on file  Social Connections: Not on file   SDOH:  SDOH Screenings   Alcohol Screen: Not on file  Depression (JYN8-2): Not on file  Financial Resource Strain: Not on file  Food Insecurity: Not on file  Housing: Not on file  Physical Activity: Not on file  Social Connections: Not on file  Stress: Not on file   Tobacco Use: High Risk  . Smoking Tobacco Use: Current Every Day Smoker  . Smokeless Tobacco Use: Never Used  Transportation Needs: Not on file    Has this patient used any form of tobacco in the last 30 days? (Cigarettes, Smokeless Tobacco, Cigars, and/or Pipes) A prescription for an FDA-approved tobacco cessation medication was offered at discharge and the patient refused  Current Medications:  No current facility-administered medications for this encounter.   No current outpatient medications on file.   Facility-Administered Medications Ordered in Other Encounters  Medication Dose Route Frequency Provider Last Rate Last Admin  . acetaminophen (TYLENOL) tablet 650 mg  650 mg Oral Q6H PRN Antonieta Pert, MD      . alum & mag hydroxide-simeth (MAALOX/MYLANTA) 200-200-20 MG/5ML suspension 30 mL  30 mL Oral Q4H PRN Antonieta Pert, MD      . hydrOXYzine (ATARAX/VISTARIL) tablet 25 mg  25 mg Oral TID PRN Antonieta Pert, MD      . traZODone (DESYREL) tablet 50 mg  50 mg Oral QHS PRN Jola Babinski Marlane Mingle, MD        PTA Medications: (Not in a hospital admission)   Musculoskeletal  Strength & Muscle Tone: within normal limits Gait & Station: normal Patient leans: N/A  Psychiatric Specialty Exam  Presentation  General Appearance: Appropriate for Environment  Eye Contact:Good  Speech:Clear and Coherent  Speech Volume:Normal  Handedness:Right   Mood and Affect  Mood:Depressed  Affect:Congruent; Depressed   Thought Process  Thought Processes:Coherent  Descriptions of Associations:Intact  Orientation:Full (Time, Place and Person)  Thought Content:WDL  Diagnosis of Schizophrenia or Schizoaffective disorder in past: No    Hallucinations:Hallucinations: None  Ideas of Reference:None  Suicidal Thoughts:Suicidal Thoughts: Yes, Active SI Active Intent and/or Plan: With Plan; With Intent  Homicidal Thoughts:Homicidal Thoughts: No   Sensorium   Memory:Immediate Good; Recent Good; Remote Good  Judgment:Fair  Insight:Good   Executive Functions  Concentration:Good  Attention Span:Good  Recall:Good  Fund of Knowledge:Good  Language:Good   Psychomotor Activity  Psychomotor Activity:Psychomotor Activity: Normal   Assets  Assets:Communication Skills; Desire for Improvement; Housing; Social Support; Physical Health; Vocational/Educational   Sleep  Sleep:Sleep: Fair   Nutritional Assessment (For OBS and [facility] admissions only) Has the patient had a weight loss or gain of 10 pounds or more in the last 3 months?: No Has the patient had a decrease in food intake/or appetite?: No Does the patient have dental problems?: No Does the patient have eating habits or behaviors that may be indicators of an eating disorder including binging or inducing vomiting?: No Has the patient recently lost weight without trying?: No Has the patient been eating poorly because of a decreased appetite?: No Malnutrition Screening Tool Score: 0    Physical Exam  Physical Exam Vitals and nursing note reviewed.  Cardiovascular:     Rate and Rhythm: Normal rate and regular rhythm.     Pulses: Normal pulses.  Neurological:  Mental Status: He is alert.  Psychiatric:        Attention and Perception: Attention normal.        Mood and Affect: Mood is depressed.        Speech: Speech normal.        Behavior: Behavior normal.        Thought Content: Thought content normal.        Cognition and Memory: Cognition normal.        Judgment: Judgment normal.    ROS Blood pressure 110/80, pulse 89, temperature 97.9 F (36.6 C), resp. rate 18, SpO2 100 %. There is no height or weight on file to calculate BMI.  Demographic Factors:  Male  Loss Factors: Loss of significant relationship  Historical Factors: Family history of mental illness or substance abuse  Risk Reduction Factors:   Employed- [employer]   Continued Clinical Symptoms:   Severe Anxiety and/or Agitation Alcohol/Substance Abuse/Dependencies  Cognitive Features That Contribute To Risk:  Closed-mindedness    Suicide Risk:  Minimal: No identifiable suicidal ideation.  Patients presenting with no risk factors but with morbid ruminations; may be classified as minimal risk based on the severity of the depressive symptoms  Plan Of Care/Follow-up recommendations:  Activity:  as tolerated Diet:  heart healthy   - Inpatient admission accepted to [facility] unit  Disposition: Take all medications as prescribed. Keep all follow-up appointments as scheduled.  Do not consume alcohol or use illegal drugs while on prescription medications. Report any adverse effects from your medications to your primary care provider promptly.  In the event of recurrent symptoms or worsening symptoms, call 911, a crisis hotline, or go to the nearest emergency department for evaluation.   Oneta Rack, NP 09/13/2020, 1:23 PM

## 2020-09-13 NOTE — ED Notes (Signed)
Pt asleep in bed. Respirations even and unlabored. Will continue to monitor for safety. ?

## 2020-09-13 NOTE — ED Notes (Signed)
Awake sitting on bed. Not talkitive during vitals. Flat responses to few questions. No s/s pain, discomfort, or acute distress noted. Will continue to monitor for safety

## 2020-09-13 NOTE — Plan of Care (Signed)
Nurse discussed anxiety, depression and coping skills with patient.  

## 2020-09-13 NOTE — Progress Notes (Signed)
The patient shared in group that he had a good talk with his best friend whom he apologized to as well. He states that he has a number of people that he intends to apologize to. He spoke of his his history of addiction and is trying to replace his bad behavior with something more positive. His goal for tomorrow is to begin to think about his children.

## 2020-09-13 NOTE — BHH Group Notes (Signed)
Type of Therapy and Topic: Group Therapy: Anger Management   Participation: Attended  Description of Group: In this group, patients will learn helpful strategies and techniques to manage anger, express anger in alternative ways, change hostile attitudes, and prevent aggressive acts, such as verbal abuse and violence.This group will be process-oriented and eductional, with patients participating in exploration of their own experiences as well as giving and receiving support and challenge from other group members.  Therapeutic Goals: 1. Patient will learn to manage anger. 2. Patient will learn to stop violence or the threat of violence. 3. Patient will learn to develop self control over thoughts and actions. 4. Patient will receive support and feedback from others  Therapeutic Modalities: Cognitive Behavioral Therapy Solution Focused Therapy Motivational Interviewing  Nevena Rozenberg, LCSWA Clinicial Social Worker [facility] 

## 2020-09-14 DIAGNOSIS — F1410 Cocaine abuse, uncomplicated: Secondary | ICD-10-CM

## 2020-09-14 DIAGNOSIS — F32A Depression, unspecified: Secondary | ICD-10-CM

## 2020-09-14 MED ORDER — ESCITALOPRAM OXALATE 10 MG PO TABS
10.0000 mg | ORAL_TABLET | Freq: Every day | ORAL | Status: DC
  Administered 2020-09-14 – 2020-09-16 (×3): 10 mg via ORAL
  Filled 2020-09-14 (×5): qty 1

## 2020-09-14 MED ORDER — LORAZEPAM 1 MG PO TABS: 1.0000 mg | ORAL_TABLET | Freq: Four times a day (QID) | ORAL | Status: DC | PRN

## 2020-09-14 MED ORDER — HYDROXYZINE HCL 25 MG PO TABS
25.0000 mg | ORAL_TABLET | Freq: Four times a day (QID) | ORAL | Status: DC | PRN
  Administered 2020-09-14 – 2020-09-16 (×4): 25 mg via ORAL
  Filled 2020-09-14 (×4): qty 1
  Filled 2020-09-14: qty 10
  Filled 2020-09-14: qty 1

## 2020-09-14 MED ORDER — ADULT MULTIVITAMIN W/MINERALS CH
1.0000 | ORAL_TABLET | Freq: Every day | ORAL | Status: DC
  Administered 2020-09-14 – 2020-09-16 (×3): 1 via ORAL
  Filled 2020-09-14 (×5): qty 1

## 2020-09-14 MED ORDER — LOPERAMIDE HCL 2 MG PO CAPS: 2.0000 mg | ORAL_CAPSULE | ORAL | Status: DC | PRN

## 2020-09-14 MED ORDER — THIAMINE HCL 100 MG PO TABS
100.0000 mg | ORAL_TABLET | Freq: Every day | ORAL | Status: DC
  Administered 2020-09-15 – 2020-09-16 (×2): 100 mg via ORAL
  Filled 2020-09-14 (×3): qty 1

## 2020-09-14 MED ORDER — GABAPENTIN 100 MG PO CAPS
100.0000 mg | ORAL_CAPSULE | Freq: Three times a day (TID) | ORAL | Status: DC
  Administered 2020-09-14 – 2020-09-16 (×5): 100 mg via ORAL
  Filled 2020-09-14 (×8): qty 1

## 2020-09-14 MED ORDER — NICOTINE 21 MG/24HR TD PT24
21.0000 mg | MEDICATED_PATCH | Freq: Every day | TRANSDERMAL | Status: DC
  Filled 2020-09-14 (×4): qty 1

## 2020-09-14 MED ORDER — ONDANSETRON 4 MG PO TBDP: 4.0000 mg | ORAL_TABLET | Freq: Four times a day (QID) | ORAL | Status: DC | PRN

## 2020-09-14 NOTE — BHH Counselor (Signed)
Adult Comprehensive Assessment  Patient ID: Julian Clarke, male   DOB: Nov 02, 1985, 35 y.o.   MRN: [id number]  Information Source: Information source: Patient  Current Stressors:  Patient states their primary concerns and needs for treatment are:: "I posted an attempt on facebook live" Bereavement / Loss: Per chart pt had one child her passed in 09-06-17 at the age of 3 years old and a sister that died from cancer 1 year ago  Living/Environment/Situation:  Living Arrangements: Other (Comment) (Grandparents) Who else lives in the home?: brother and grandparents How long has patient lived in current situation?: "off and on for most of my life" What is atmosphere in current home: Supportive,Temporary,Comfortable  Family History:  Marital status: Long term relationship Long term relationship, how long?: off and on for 13 years What types of issues is patient dealing with in the relationship?: reports both have been unfaithful Are you sexually active?: Yes What is your sexual orientation?: heterosexual Does patient have children?: Yes How many children?: 7 How is patient's relationship with their children?: Pt has 7 children with 5 different women; his involvement with each of them is different. One child died at age 60 (in 09/06/2017) after being hit by a car; 3 together: pretty good  Childhood History:  By whom was/is the patient raised?: Grandparents Additional childhood history information: Pt's mother has a hx of Substance Abuse Description of patient's relationship with caregiver when they were a child: "not too bad" Patient's description of current relationship with people who raised him/her: Pt has a strong relationship with his mother and his maternal grandparents. How were you disciplined when you got in trouble as a child/adolescent?: "spanking/ grounding" Does patient have siblings?: Yes Number of Siblings: 3 Description of patient's current relationship with siblings: 1 sister  (younger) died from cancer 1 year ago; 1 sister (older) he doesn't speak to; his brother (older) experienced a loss of oxygen and is special needs. Did patient suffer any verbal/emotional/physical/sexual abuse as a child?: No Did patient suffer from severe childhood neglect?: No Has patient ever been sexually abused/assaulted/raped as an adolescent or adult?: No Was the patient ever a victim of a crime or a disaster?: No Witnessed domestic violence?: No Has patient been affected by domestic violence as an adult?: No  Education:  Highest grade of school patient has completed: Some college; Conservation officer, historic buildings Currently a Consulting civil engineer?: No Learning disability?: No  Employment/Work Situation:   Employment situation: Employed Where is patient currently employed?: [employer] Patient's job has been impacted by current illness: Yes Describe how patient's job has been impacted: "stressed out easily" What is the longest time patient has a held a job?: 10  years Where was the patient employed at that time?: [employer] Has patient ever been in the [employer]?: No  Financial Resources:   Financial resources: Income from employment Does patient have a representative payee or guardian?: No  Alcohol/Substance Abuse:   What has been your use of drugs/alcohol within the last 12 months?: pt reports using cocaine every other day, marijuana every other week, alcohol every other month and extacy every other year If attempted suicide, did drugs/alcohol play a role in this?: No Alcohol/Substance Abuse Treatment Hx: Past Tx, Inpatient If yes, describe treatment: [facility] 07-Sep-2019; [facility] Sep 07, 2019 Has alcohol/substance abuse ever caused legal problems?: No  Social Support System:   Conservation officer, nature Support System: Poor Describe Community Support System: "self" Type of faith/religion: None How does patient's faith help to cope with current illness?: None  Leisure/Recreation:   Do You Have Hobbies?:  Yes Leisure and Hobbies: "play video games, listen to music"  Strengths/Needs:   What is the patient's perception of their strengths?: "communication"  Discharge Plan:   Currently receiving community mental health services: No Patient states concerns and preferences for aftercare planning are: none Patient states they will know when they are safe and ready for discharge when: "I am ready now. I made a mistake." Does patient have access to transportation?: Yes (family/walking) Does patient have financial barriers related to discharge medications?: No Will patient be returning to same living situation after discharge?: Yes  Summary/Recommendations:  Julian Clarke is a 35 year old male who presented after a suicide attempt on Facebook Live. Pt reports no current stressors. Pt currently lives with his grandparents. Pt is currently in a relationship and identifies as heterosexual. Pt reports that they are currently sexually active. Pt reports that they have 7 kids. Pt is currently employed at [employer]. Pt describes their support system as Good. Pt currently sees no outpatient providers. Pt will live at his grandparents when they discharge and will be picked up by family. While here, Julian Clarke can benefit from crisis stabilization, medication management, therapeutic milieu, and referrals for services.    Julian Clarke. 09/14/2020

## 2020-09-14 NOTE — Progress Notes (Cosign Needed)
Adult Psychoeducational Group Note  Date:  09/14/2020 Time:  10:47 AM  Group Topic/Focus:  Goals Group:   The focus of this group is to help patients establish daily goals to achieve during treatment and discuss how the patient can incorporate goal setting into their daily lives to aide in recovery.  Participation Level:  Active  Participation Quality:  Appropriate  Affect:  Appropriate  Cognitive:  Appropriate  Insight: Appropriate  Engagement in Group:  Engaged  Modes of Intervention:  Discussion  Additional Comments:  Pt attended group and participated in discussion.  [name] [name] 09/14/2020, 10:47 AM

## 2020-09-14 NOTE — Progress Notes (Signed)
Pt said that he has been punishing himself lately for doing drugs, lying, and betraying his friends too. Pt said that when he was suicidal he wanted to quit everything in that moment. He also said that he wasn't taking any medications at home. Pt said that he regrets some of his decisions in life. He denies any withdrawal symptoms. He is working on Arts administrator to his friends. Pt denies SI/HI and AVH. Active listening, reassurance, and support provided. Medications administered as ordered by provider. Q 15 min safety checks continue. Pt's safety has been maintained.   09/13/20 2123  Psych Admission Type (Psych Patients Only)  Admission Status Involuntary  Psychosocial Assessment  Patient Complaints Anxiety;Depression;Sadness;Worrying  Eye Contact Fair  Facial Expression Anxious;Flat;Sullen;Sad;Worried  Affect Anxious;Appropriate to circumstance;Depressed;Sad;Flat  Speech Logical/coherent  Interaction Forwards little;Minimal;Guarded  Motor Activity Other (Comment) (steady)  Appearance/Hygiene Unremarkable  Behavior Characteristics Cooperative;Anxious;Guarded  Mood Depressed;Anxious;Sad;Sullen  Thought Process  Coherency WDL  Content Blaming self  Delusions None reported or observed  Perception WDL  Hallucination None reported or observed  Judgment Impaired  Confusion None  Danger to Self  Current suicidal ideation? Denies  Self-Injurious Behavior No self-injurious ideation or behavior indicators observed or expressed   Danger to Others  Danger to Others None reported or observed

## 2020-09-14 NOTE — Progress Notes (Signed)
   09/14/20 0725  Vital Signs  Pulse Rate 89  BP 139/77  BP Location Right Arm  BP Method Automatic  Patient Position (if appropriate) Standing   D: Patient denies SI/HI/AVH. Pt rated anxiety 5/10 and denied depression. Pt. Out in open areas and attended groups.  A:  Patient took scheduled medicine.  Support and encouragement provided Routine safety checks conducted every 15 minutes. Patient  Informed to notify staff with any concerns.   R: Safety maintained.

## 2020-09-14 NOTE — H&P (Signed)
Psychiatric Admission Assessment Adult  Patient Identification: Julian Clarke MRN:  [id number] Date of Evaluation:  09/14/2020 Chief Complaint:  Suicide attempt  Principal Diagnosis: Depression, unspecified Diagnosis:  Principal Problem:   Depression, unspecified Active Problems:   Suicidal behavior   Cocaine abuse ([place])  History of Present Illness: The patient is a 35y/o male with self-reported past psychiatric history significant for ADHD in childhood, who was admitted under IVC for suicide attempt via hanging.   The patient states that he relapsed with cocaine use on Friday, and states his long-time girlfriend told him not to come home and broke up with him on Saturday in the context of his substance use. He states that on Sunday he started feeling hopeless and was disappointed in himself regarding his relapse with cocaine which he feels impulsively led to his suicide attempt. He admits he was also "trying to get attention" from his ex-girlfriend with his suicide attempt. He reports that he decided to post his suicide attempt on Facebook live in an effort to "leave a message" about what he felt were "responsibilities and expectations for life." He describes tying a noose out of a drop cord and states he was in the process of putting it around his neck when the police arrived. He assumes that his ex-girlfriend saw his Facebook post and called the police who took him to the hospital under IVC.   Leading up to his suicide attempt he states he had been struggling to stay clean and sober. He reports snorting or smoking cocaine daily for several days at a time and then not using cocaine for several weeks before returning to cocaine use. He estimates that he used 2 1/2 grams of cocaine or more with his most recent relapse. He admits to [facility] use off and on about once a week for an unknown length of time, and he endorses a more remote h/o ecstasy, Fentanyl, PCP, acid, and mushroom use in the past. He  states he additionally has been drinking a six pack of beer weekly for an unknown length of time. He reports that the longest he has been clean is 2-3 months, and he states he did attend residential rehab at [facility] 1 year ago. He denies any h/o withdrawal, seizures, or DTs in the past and denies current symptoms of withdrawal.   He states that leading up to his relapse he had not been feeling depressed or hopeless. After his girlfriend broke up with him he started ruminating about his relapse and states he was feeling overwhelmed with negative self-talk. He states that leading up to the suicide attempt he had been sleeping well unless he was using drugs. He denies recent issues with anhedonia and states his focus, energy, and appetite have been good. He admits to current feelings of guilt about his attempt and his relapse. He denies h/o mania or hypomania in the past and denies AVH or paranoia. He states he does have periods of worrying, general sense of anxiety, and periods of irritability but he denies h/o panic attacks or PTSD symptoms. He states he has made threats of suicide in the past but has never previously tried to hurt himself before this attempt. When questioned about other recent stressors prior to his suicide attempt, he was evasive. Per EHR, he reported prior to admission that he has been having custody issues for his children and legal matters with his driving record. 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   Total Time Spent in Direct Patient Care:  I personally spent 60 minutes on the unit in direct patient care. The direct patient care time included face-to-face time with the patient, reviewing the patient's chart, communicating with other professionals, and coordinating care. Greater than 50% of this time was spent in counseling or coordinating care with the patient regarding goals of hospitalization,  psycho-education, and discharge planning needs.  Past Psychiatric History:  Previous Psychiatric Diagnoses: ADHD diagnosed in elementary school; per EHR h/o anxiety and depression Current / Past Mental Health Providers: Denied  Previous Psychological Evaluations: Yes  Prior Inpatient or Outpatient Therapy: Denied Past Psychiatric Hospitalizations: Denied Past Suicide Attempts: Reports making previous threats but never attempting in the past (per EHR he reported previously drinking a bottle of Nyquil as a past suicide attempt) Past History of Homicidal Behaviors / Violent or Aggressive Behaviors: Denied History of Self-Mutilation: Denied Previous Participation in PHP/IOP or Residential Programs: Denied Past History of ECT / Kelly Services / VNS: Denied Past Psychotropic Medication Trials: Ritalin as a child  Is the patient at risk to self? Yes.    Has the patient been a risk to self in the past 6 months? Yes.    Has the patient been a risk to self within the distant past? Yes.    Is the patient a risk to others? No.  Has the patient been a risk to others in the past 6 months? No.  Has the patient been a risk to others within the distant past? No.   Substance Use History: Substance Abuse History in the last 12 months: Yes.   Illicit Drug Use: Admits to snorting or smoking cocaine daily for several days with a few weeks between each use with last use on Friday of 2 1/2 grams; admits to [facility] use once every other week for unknown length of time; admits to ecstasy use (unknown amount) about once a month with last use 3-4 months ago; reports h/o acid use last in 2015 and mushroom use last in 2011 or 2012 IV Drug Use: No. Alcohol Use / Abuse: Reports drinking a 6 pack of beer/week - unclear when he had his last drink Prescription Drug Abuse: States he tried Fentanyl once History of Detox / Rehab: went to 30 day residential rehab at [facility] last year History of Withdrawal / Blackouts / DTs:  Denied Consequences of Substance Use: states he quit a job in the past due to drug use  Alcohol Screening: 1. How often do you have a drink containing alcohol?: 2 to 4 times a month 2. How many drinks containing alcohol do you have on a typical day when you are drinking?: 3 or 4 3. How often do you have six or more drinks on one occasion?: Never AUDIT-C Score: 3 4. How often during the last year have you found that you were not able to stop drinking once you had started?: Never 5. How often during the last year have you failed to do what was normally expected from you because of drinking?: Never 6. How often during the last year have you needed a first drink in the morning to get yourself going after a heavy drinking session?: Never 7. How often during the last year have you had a feeling of guilt of remorse after drinking?: Never 8. How often during the last year have you been unable to remember what happened the night before because you had been drinking?: Never 9. Have you  or someone else been injured as a result of your drinking?: No 10. Has a relative or friend or a doctor or another health worker been concerned about your drinking or suggested you cut down?: No Alcohol Use Disorder Identification Test Final Score (AUDIT): 3  Past Medical History: Denied per patient (Per EHR h/o HTN)  Past Surgical History: Denied  Family Medical History: sister had breast and lung cancer with brain metastasis; Father died with liver and kidney failure  Family Psychiatric  History: Mother is sober from unknown substance abuse issues; Father was an alcoholic; no known psychiatric illness or suicides in the family  Tobacco Screening: Have you used any form of tobacco in the last 30 days? (Cigarettes, Smokeless Tobacco, Cigars, and/or Pipes): Yes Tobacco use, Select all that apply: 5 or more cigarettes per day Are you interested in Tobacco Cessation Medications?: No, patient refused Counseled patient on  smoking cessation including recognizing danger situations, developing coping skills and basic information about quitting provided: Yes  1 ppd  Social History:  History of Physical / Emotional / Sexual Abuse: Denied Highest Level of Education Obtained: some college Occupational History / Employment Status: Scientist, water quality at [employer] Marital Status / Relationship History: Long-term relationship with ex-girlfriend of 13 years - broke up this past weekend Parenting History: Reports has 3 children with his long-term girlfriend (per EHR has 7 children) Living Situation: was living with ex-girlfriend and his 3 sons - now plans to live with grandparents Spiritual History: raised Christian but not currently practicing any Tree surgeon Service: Denied Current / Pending / Patent examiner or Previous Scientist, forensic / Prison Time: per EHR has legal issues related to driving Access to Firearms: Denied  Additional Social History: Marital status: Long term relationship Long term relationship, how long?: off and on for 13 years What types of issues is patient dealing with in the relationship?: reports both have been unfaithful Are you sexually active?: Yes What is your sexual orientation?: heterosexual Does patient have children?: Yes How many children?: 7 How is patient's relationship with their children?: Pt has 7 children with 5 different women; his involvement with each of them is different. One child died at age 37 (in 09-02-2017) after being hit by a car; 3 together: pretty good    Pain Medications: see MAR Prescriptions: see MAR Over the Counter: see MAR History of alcohol / drug use?: Yes Longest period of sobriety (when/how long): unsure Negative Consequences of Use: Financial,Personal relationships,Work / School Withdrawal Symptoms: Other (Comment) (denied withdrawals) Name of Substance 1: alcohol 1 - Age of First Use: 35 yrs old 1 - Amount (size/oz): one beer every other week 1 - Frequency: every  other week 1 - Duration: since age of 35 yrs old 1 - Last Use / Amount: Saturday 1 - Method of Aquiring: buying 1- Route of Use: drinks beer Name of Substance 2: cocaine 2 - Age of First Use: 35 yrs old 2 - Amount (size/oz): 1 gram to 3 grams daily 2 - Frequency: daily 2 - Duration: since age of 35 yrs old 2 - Last Use / Amount: Saturday 2 - Method of Aquiring: buying 2 - Route of Substance Use: snort adn smoke Name of Substance 3: THC 3 - Age of First Use: 35 yrs old 3 - Amount (size/oz): uses every other month 3 - Frequency: every other month 3 - Duration: since 35 yrs old 3 - Last Use / Amount: Saturday morning 3 - Method of Aquiring: buys 3 - Route  of Substance Use: smokes  Allergies:  No Known Allergies   Lab Results:  Results for orders placed or performed during the hospital encounter of 09/13/20 (from the past 48 hour(s))  CBC     Status: None   Collection Time: 09/13/20  6:08 PM  Result Value Ref Range   WBC 6.4 4.0 - 10.5 K/uL   RBC 4.33 4.22 - 5.81 MIL/uL   Hemoglobin 13.7 13.0 - 17.0 g/dL   HCT 41.3 39.0 - 52.0 %   MCV 95.4 80.0 - 100.0 fL   MCH 31.6 26.0 - 34.0 pg   MCHC 33.2 30.0 - 36.0 g/dL   RDW 11.8 11.5 - 15.5 %   Platelets 242 150 - 400 K/uL   nRBC 0.0 0.0 - 0.2 %    Comment: Performed at [facility], [place] [address], [place], [place] [address]  Comprehensive metabolic panel     Status: Abnormal   Collection Time: 09/13/20  6:08 PM  Result Value Ref Range   Sodium 137 135 - 145 mmol/L   Potassium 3.9 3.5 - 5.1 mmol/L   Chloride 105 98 - 111 mmol/L   CO2 28 22 - 32 mmol/L   Glucose, Bld 141 (H) 70 - 99 mg/dL    Comment: Glucose reference range applies only to samples taken after fasting for at least 8 hours.   BUN 17 6 - 20 mg/dL   Creatinine, Ser 1.21 0.61 - 1.24 mg/dL   Calcium 9.3 8.9 - 10.3 mg/dL   Total Protein 7.3 6.5 - 8.1 g/dL   Albumin 4.0 3.5 - 5.0 g/dL   AST 32 15 - 41 U/L   ALT 33 0 - 44 U/L   Alkaline  Phosphatase 36 (L) 38 - 126 U/L   Total Bilirubin 0.6 0.3 - 1.2 mg/dL   GFR, Estimated >60 >60 mL/min    Comment: (NOTE) Calculated using the CKD-EPI Creatinine Equation (2021)    Anion gap 4 (L) 5 - 15    Comment: Performed at [facility], [place] [address], [place], [place] [address]  TSH     Status: None   Collection Time: 09/13/20  6:08 PM  Result Value Ref Range   TSH 2.170 0.350 - 4.500 uIU/mL    Comment: Performed by a 3rd Generation assay with a functional sensitivity of <=0.01 uIU/mL. Performed at [facility], [place] [address], [place], [place] [address]    Blood Alcohol level:  No results found for: [facility]  Current Medications: Current Facility-Administered Medications  Medication Dose Route Frequency Provider Last Rate Last Admin  . acetaminophen (TYLENOL) tablet 650 mg  650 mg Oral Q6H PRN Sharma Covert, MD      . alum & mag hydroxide-simeth (MAALOX/MYLANTA) 200-200-20 MG/5ML suspension 30 mL  30 mL Oral Q4H PRN Sharma Covert, MD      . escitalopram (LEXAPRO) tablet 10 mg  10 mg Oral Daily Viann Fish E, MD   10 mg at 09/14/20 1540  . gabapentin (NEURONTIN) capsule 100 mg  100 mg Oral TID Viann Fish E, MD   100 mg at 09/14/20 1649  . hydrOXYzine (ATARAX/VISTARIL) tablet 25 mg  25 mg Oral Q6H PRN Nelda Marseille, Dejane Scheibe E, MD      . loperamide (IMODIUM) capsule 2-4 mg  2-4 mg Oral PRN Nelda Marseille, Kammy Klett E, MD      . LORazepam (ATIVAN) tablet 1 mg  1 mg Oral Q6H PRN Harlow Asa, MD      .  multivitamin with minerals tablet 1 tablet  1 tablet Oral Daily Harlow Asa, MD   1 tablet at 09/14/20 1540  . nicotine polacrilex (NICORETTE) gum 2 mg  2 mg Oral PRN Sharma Covert, MD      . ondansetron (ZOFRAN-ODT) disintegrating tablet 4 mg  4 mg Oral Q6H PRN Harlow Asa, MD      . Derrill Memo ON 09/15/2020] thiamine tablet 100 mg  100 mg Oral Daily Nelda Marseille, Gala Padovano E, MD      . traZODone (DESYREL) tablet 50 mg  50 mg Oral QHS PRN Sharma Covert, MD   50 mg at 09/13/20 2123   PTA Medications: No medications prior to admission.   Musculoskeletal: Strength & Muscle Tone: within normal limits Gait & Station: normal, steady Patient leans: N/A  Psychiatric Specialty Exam: Physical Exam Vitals reviewed.  HENT:     Head: Normocephalic.     Mouth/Throat:     Mouth: Mucous membranes are moist.  Eyes:     Extraocular Movements: Extraocular movements intact.  Cardiovascular:     Rate and Rhythm: Normal rate and regular rhythm.  Pulmonary:     Effort: Pulmonary effort is normal.     Breath sounds: Normal breath sounds.  Musculoskeletal:        General: Normal range of motion.  Neurological:     General: No focal deficit present.     Mental Status: He is alert.     Comments: CN 3-12 grossly intact, intact finger to nose; equal grip     Review of Systems  Constitutional: Negative for fever.  HENT: Negative for congestion.   Respiratory: Negative for cough and shortness of breath.   Cardiovascular: Negative for chest pain.  Gastrointestinal: Negative for abdominal pain, diarrhea, nausea and vomiting.  Genitourinary: Negative.   Musculoskeletal: Negative.   Skin: Negative for rash.  Neurological: Negative for headaches.    Blood pressure 139/77, pulse 89, temperature 98.4 F (36.9 C), temperature source Oral, resp. rate 18, height 5' 6"  (1.676 m), weight 118.8 kg, SpO2 100 %.Body mass index is 42.29 kg/m.  General Appearance: casually dressed, adequate hygiene  Eye Contact:  Fair  Speech:  Clear and Coherent and Normal Rate  Volume:  Normal  Mood:  Anxious and Dysphoric  Affect:  Constricted  Thought Process:  Goal Directed and Linear  Orientation:  Full (Time, Place, and Person)  Thought Content:  Logical and denies AVH, paranoia, or delusions - no acute psychosis on exam  Suicidal Thoughts:  suicide attempt prior to admission; denies current SI, intent or plan  Homicidal Thoughts:  No  Memory:  Recent;    Fair  Judgement:  Impaired prior to admission; fair on unit  Insight:  Fair  Psychomotor Activity:  Normal  Concentration:  Concentration: Good and Attention Span: Good  Recall:  Chauncey of Knowledge:  Good  Language:  Good  Akathisia:  Negative  Assets:  Communication Skills Desire for Improvement Housing Resilience Social Support  ADL's:  Intact  Cognition:  WNL  Sleep:  Number of Hours: 6.75   Treatment Plan Summary: Diagnoses / Active Problems: Unspecified depressive d/o (r/o MDD, r/o substance induced depressive d/o, r/o adjustment d/o with mixed mood) ADHD by hx 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 Safety and Monitoring:  -- Involuntary admission to inpatient psychiatric unit for safety, stabilization and treatment  --  Daily contact with patient to assess and evaluate symptoms and progress in treatment  -- Patient's case to be discussed in multi-disciplinary team meeting  -- Observation Level : q15 minute checks  -- Vital signs:  q12 hours  -- Precautions: suicide  2. Psychiatric Diagnoses and Treatment:   Unspecified depressive d/o (r/o MDD, r/o substance induced depressive d/o, r/o adjustment d/o with mixed mood)  ADHD by hx -- The risks/benefits/side-effects/alternatives to various antidepressant medications were discussed, and the patient consents to a trial of Lexapro 30m daily to help with anxiety and mood issues. The specific risks of potential sexual side-effects, GI side-effects, activation, and SI with start of Lexapro were discussed.  -- Start Neurontin 1064mtid to help with mood, anxiety, and potential withdrawal/cravings -- Vistaril 2563m6 hours PRN anxiety -- Trazodone 76m39m qhs PRN insomnia  -- I advised that given his addiction issues that treatment with a stimulant for any of his residual ADHD symptoms is not advised. I advised that in the  future his outpatient provider could consider use of Wellbutrin or Effexor to manage his mood and ADHD symptoms but given his questionable HTN history as well as his current risk of seizure with active substance use I am avoiding these antidepressants.   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  Cannabis use episodic (r/o cannabis use d/o)  R/o alcohol use d/o  -- UDS positive for cocaine and marijuana - patient counseled on the need to abstain from alcohol and illicit substances after discharge; no ETOH level obtained on admission  -- He is interested in outpatient SA treatment after discharge and declines residential rehab  -- Patient started on CIWA protocol with Ativan 1mg 33m CIWA score >10 and oral thiamine and MVI replacement  -- Patient started on Neurontin 100mg 65mto help with potential withdrawal/cravings and anxiety  -- Short Term Goals: Ability to identify triggers associated with substance abuse/mental health issues will improve  -- Long Term Goals: Improvement in symptoms so as ready for discharge   3. Medical Issues Being Addressed:   Tobacco Use Disorder  -- Nicotine patch 21mg/245murs ordered  -- Smoking cessation encouraged   Questionably h/o HTN  -- will monitor BP   Admission labs reviewed: Respiratory panel negative; WBC 6.4, H/H 13.7/41.3, platelets 242; CMP WNL except for alk phos 36 and glucose 141 with anion gap 4; TSH 2.170; will check HbgA1c and EKG  4. Discharge Planning:   -- Social work and case management to assist with discharge planning and identification of  hospital follow-up needs prior to discharge  -- Estimated LOS: 3-4 days  -- Discharge Concerns: Need to establish a safety plan; Medication compliance and effectiveness  -- Discharge Goals: Return home with outpatient referrals for mental health follow-up including medication management/psychotherapy  I certify that inpatient services furnished can reasonably be expected to improve the patient's condition.    Terril Chestnut E SHarlow AsaAPA 5/Alda Ponder0225:22 PM

## 2020-09-14 NOTE — [facility] Suicide Risk Assessment (Signed)
[facility] Admission Suicide Risk Assessment   Nursing information obtained from:  Patient Demographic factors:  Male Current Mental Status:  Suicidal ideation indicated by patient,Suicide plan,Self-harm behaviors Loss Factors:  Financial problems / change in socioeconomic status; end of long-term relationship Historical Factors:  Impulsivity, substance use prior to admission Risk Reduction Factors:  Responsible for children under 54 years of age,Sense of responsibility to family,Employed  Total Time Spent in Direct Patient Care:  I personally spent 60 minutes on the unit in direct patient care. The direct patient care time included face-to-face time with the patient, reviewing the patient's chart, communicating with other professionals, and coordinating care. Greater than 50% of this time was spent in counseling or coordinating care with the patient regarding goals of hospitalization, psycho-education, and discharge planning needs.  Principal Problem: Depression, unspecified Diagnosis:  Principal Problem:   Depression, unspecified Active Problems:   Suicidal behavior   Cocaine abuse (HCC)  Subjective Data: see admission H&P  Continued Clinical Symptoms:  Alcohol Use Disorder Identification Test Final Score (AUDIT): 3 The "Alcohol Use Disorders Identification Test", Guidelines for Use in Primary Care, Second Edition.  World Science writer [facility]). Score between 0-7:  no or low risk or alcohol related problems. Score between 8-15:  moderate risk of alcohol related problems. Score between 16-19:  high risk of alcohol related problems. Score 20 or above:  warrants further diagnostic evaluation for alcohol dependence and treatment.  CLINICAL FACTORS:   Depression:   Impulsivity Alcohol/Substance Abuse/Dependencies Previous Psychiatric Diagnoses and Treatments  Musculoskeletal: Strength & Muscle Tone: within normal limits Gait & Station: normal, steady Patient leans: N/A  Psychiatric  Specialty Exam: Physical Exam Vitals reviewed.  HENT:     Head: Normocephalic.     Mouth/Throat:     Mouth: Mucous membranes are moist.  Eyes:     Extraocular Movements: Extraocular movements intact.  Cardiovascular:     Rate and Rhythm: Normal rate and regular rhythm.  Pulmonary:     Effort: Pulmonary effort is normal.     Breath sounds: Normal breath sounds.  Musculoskeletal:        General: Normal range of motion.  Neurological:     General: No focal deficit present.     Mental Status: He is alert.     Comments: CN 3-12 grossly intact, intact finger to nose; equal grip     Review of Systems  Constitutional: Negative for fever.  HENT: Negative for congestion.   Respiratory: Negative for cough and shortness of breath.   Cardiovascular: Negative for chest pain.  Gastrointestinal: Negative for abdominal pain, diarrhea, nausea and vomiting.  Genitourinary: Negative.   Musculoskeletal: Negative.   Skin: Negative for rash.  Neurological: Negative for headaches.    Blood pressure 139/77, pulse 89, temperature 98.4 F (36.9 C), temperature source Oral, resp. rate 18, height 5\' 6"  (1.676 m), weight 118.8 kg, SpO2 100 %.Body mass index is 42.29 kg/m.  General Appearance: casually dressed, adequate hygiene  Eye Contact:  Fair  Speech:  Clear and Coherent and Normal Rate  Volume:  Normal  Mood:  Anxious and Dysphoric  Affect:  Constricted  Thought Process:  Goal Directed and Linear  Orientation:  Full (Time, Place, and Person)  Thought Content:  Logical and denies AVH, paranoia, or delusions - no acute psychosis on exam  Suicidal Thoughts:  suicide attempt prior to admission; denies current SI, intent or plan  Homicidal Thoughts:  No  Memory:  Recent;   Fair  Judgement:  Impaired prior to  admission; fair on unit  Insight:  Fair  Psychomotor Activity:  Normal  Concentration:  Concentration: Good and Attention Span: Good  Recall:  Fair  Fund of Knowledge:  Good  Language:   Good  Akathisia:  Negative  Assets:  Communication Skills Desire for Improvement Housing Resilience Social Support  ADL's:  Intact  Cognition:  WNL  Sleep:  Number of Hours: 6.75   COGNITIVE FEATURES THAT CONTRIBUTE TO RISK:  Thought constriction (tunnel vision)    SUICIDE RISK:   Moderate:  Frequent suicidal ideation with limited intensity, and duration, some specificity in terms of plans, no associated intent, good self-control, limited dysphoria/symptomatology, some risk factors present, and identifiable protective factors, including available and accessible social support.  PLAN OF CARE: see admission H&P  I certify that inpatient services furnished can reasonably be expected to improve the patient's condition.   Comer Locket, MD 09/14/2020, 6:17 PM

## 2020-09-15 LAB — COMPREHENSIVE METABOLIC PANEL
ALT: 31 U/L (ref 0–44)
AST: 24 U/L (ref 15–41)
Albumin: 3.8 g/dL (ref 3.5–5.0)
Alkaline Phosphatase: 35 U/L — ABNORMAL LOW (ref 38–126)
Anion gap: 6 (ref 5–15)
BUN: 14 mg/dL (ref 6–20)
CO2: 25 mmol/L (ref 22–32)
Calcium: 9.3 mg/dL (ref 8.9–10.3)
Chloride: 104 mmol/L (ref 98–111)
Creatinine, Ser: 0.98 mg/dL (ref 0.61–1.24)
GFR, Estimated: >60 mL/min (ref >60)
Glucose, Bld: 161 mg/dL — ABNORMAL HIGH (ref 70–99)
Potassium: 4.1 mmol/L (ref 3.5–5.1)
Sodium: 135 mmol/L (ref 135–145)
Total Bilirubin: 0.5 mg/dL (ref 0.3–1.2)
Total Protein: 6.9 g/dL (ref 6.5–8.1)

## 2020-09-15 LAB — HEMOGLOBIN A1C
Hgb A1c MFr Bld: 5.3 % (ref 4.8–5.6)
Mean Plasma Glucose: 105.41 mg/dL

## 2020-09-15 NOTE — Progress Notes (Signed)
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 The patient found out that his relationship with his girlfriend is over with and he will not be able to see his children for a while. His goal for tomorrow is to inquire about why his lips are numb and why is it that he is so drowsy.

## 2020-09-15 NOTE — Progress Notes (Signed)
Pt stated he was feeling better due to the medications. Pt visible on the unit. Pt given PRN Trazodone and Vistaril per [facility]     09/15/20 0000  Psychosocial Assessment  Eye Contact Fair  Facial Expression Fixed smile  Affect Anxious;Appropriate to circumstance;Depressed;Sad;Flat  Speech Logical/coherent  Interaction Minimal;Guarded  Motor Activity Other (Comment) (steady)  Appearance/Hygiene Unremarkable  Behavior Characteristics Anxious;Cooperative  Mood Anxious  Thought Process  Coherency WDL  Content WDL  Delusions None reported or observed  Perception WDL  Hallucination None reported or observed  Judgment Impaired  Confusion None  Danger to Self  Current suicidal ideation? Denies  Self-Injurious Behavior No self-injurious ideation or behavior indicators observed or expressed   Agreement Not to Harm Self Yes  Description of Agreement contracts for safety verbal  Danger to Others  Danger to Others None reported or observed

## 2020-09-15 NOTE — [facility] Suicide Risk Assessment (Signed)
[facility] INPATIENT:  Family/Significant Other Suicide Prevention Education  Suicide Prevention Education:  Contact Attempts:  Julian Clarke and Julian Clarke (grandparents) [phone], (name of family member/significant other) has been identified by the patient as the family member/significant other with whom the patient will be residing, and identified as the person(s) who will aid the patient in the event of a mental health crisis.  With written consent from the patient, two attempts were made to provide suicide prevention education, prior to and/or following the patient's discharge.  We were unsuccessful in providing suicide prevention education.  A suicide education pamphlet was given to the patient to share with family/significant other.  Date and time of first attempt: 09/15/20  3:47pm CSW left a HIPAA compliant message. Date and time of second attempt: CSW will continue to attempt to make contact.   [name] 09/15/2020, 3:46 PM

## 2020-09-15 NOTE — Tx Team (Signed)
Interdisciplinary Treatment and Diagnostic Plan Update  09/15/2020 Time of Session: 9:20am Julian Clarke MRN: [id number]  Principal Diagnosis: Depression, unspecified  Secondary Diagnoses: Principal Problem:   Depression, unspecified Active Problems:   Suicidal behavior   Cocaine abuse (HCC)   Current Medications:  Current Facility-Administered Medications  Medication Dose Route Frequency Provider Last Rate Last Admin  . acetaminophen (TYLENOL) tablet 650 mg  650 mg Oral Q6H PRN Antonieta Pert, MD      . alum & mag hydroxide-simeth (MAALOX/MYLANTA) 200-200-20 MG/5ML suspension 30 mL  30 mL Oral Q4H PRN Antonieta Pert, MD      . escitalopram (LEXAPRO) tablet 10 mg  10 mg Oral Daily Comer Locket, MD   10 mg at 09/15/20 0846  . gabapentin (NEURONTIN) capsule 100 mg  100 mg Oral TID Bartholomew Crews E, MD   100 mg at 09/15/20 0846  . hydrOXYzine (ATARAX/VISTARIL) tablet 25 mg  25 mg Oral Q6H PRN Comer Locket, MD   25 mg at 09/15/20 0850  . loperamide (IMODIUM) capsule 2-4 mg  2-4 mg Oral PRN Mason Jim, Amy E, MD      . LORazepam (ATIVAN) tablet 1 mg  1 mg Oral Q6H PRN Comer Locket, MD      . multivitamin with minerals tablet 1 tablet  1 tablet Oral Daily Mason Jim, Amy E, MD   1 tablet at 09/15/20 0846  . nicotine (NICODERM CQ - dosed in mg/24 hours) patch 21 mg  21 mg Transdermal Daily Singleton, Amy E, MD      . ondansetron (ZOFRAN-ODT) disintegrating tablet 4 mg  4 mg Oral Q6H PRN Mason Jim, Amy E, MD      . thiamine tablet 100 mg  100 mg Oral Daily Mason Jim, Amy E, MD   100 mg at 09/15/20 0847  . traZODone (DESYREL) tablet 50 mg  50 mg Oral QHS PRN Antonieta Pert, MD   50 mg at 09/14/20 2141   PTA Medications: No medications prior to admission.    Patient Stressors: Paediatric nurse issue Marital or family conflict Occupational concerns Substance abuse Traumatic event  Patient Strengths: Ability for insight Average or above average  intelligence Capable of independent living Communication skills General fund of knowledge Motivation for treatment/growth Physical Health Work skills  Treatment Modalities: Medication Management, Group therapy, Case management,  1 to 1 session with clinician, Psychoeducation, Recreational therapy.   Physician Treatment Plan for Primary Diagnosis: Depression, unspecified Long Term Goal(s): Improvement in symptoms so as ready for discharge Improvement in symptoms so as ready for discharge   Short Term Goals: Ability to identify changes in lifestyle to reduce recurrence of condition will improve Ability to demonstrate self-control will improve Ability to identify and develop effective coping behaviors will improve Ability to identify triggers associated with substance abuse/mental health issues will improve  Medication Management: Evaluate patient's response, side effects, and tolerance of medication regimen.  Therapeutic Interventions: 1 to 1 sessions, Unit Group sessions and Medication administration.  Evaluation of Outcomes: Progressing  Physician Treatment Plan for Secondary Diagnosis: Principal Problem:   Depression, unspecified Active Problems:   Suicidal behavior   Cocaine abuse (HCC)  Long Term Goal(s): Improvement in symptoms so as ready for discharge Improvement in symptoms so as ready for discharge   Short Term Goals: Ability to identify changes in lifestyle to reduce recurrence of condition will improve Ability to demonstrate self-control will improve Ability to identify and develop effective coping behaviors will improve Ability to identify triggers associated  with substance abuse/mental health issues will improve     Medication Management: Evaluate patient's response, side effects, and tolerance of medication regimen.  Therapeutic Interventions: 1 to 1 sessions, Unit Group sessions and Medication administration.  Evaluation of Outcomes: Progressing   RN  Treatment Plan for Primary Diagnosis: Depression, unspecified Long Term Goal(s): Knowledge of disease and therapeutic regimen to maintain health will improve  Short Term Goals: Ability to remain free from injury will improve, Ability to verbalize frustration and anger appropriately will improve, Ability to identify and develop effective coping behaviors will improve and Compliance with prescribed medications will improve  Medication Management: RN will administer medications as ordered by provider, will assess and evaluate patient's response and provide education to patient for prescribed medication. RN will report any adverse and/or side effects to prescribing provider.  Therapeutic Interventions: 1 on 1 counseling sessions, Psychoeducation, Medication administration, Evaluate responses to treatment, Monitor vital signs and CBGs as ordered, Perform/monitor CIWA, COWS, AIMS and Fall Risk screenings as ordered, Perform wound care treatments as ordered.  Evaluation of Outcomes: Progressing   LCSW Treatment Plan for Primary Diagnosis: Depression, unspecified Long Term Goal(s): Safe transition to appropriate next level of care at discharge, Engage patient in therapeutic group addressing interpersonal concerns.  Short Term Goals: Engage patient in aftercare planning with referrals and resources, Increase social support, Identify triggers associated with mental health/substance abuse issues and Increase skills for wellness and recovery  Therapeutic Interventions: Assess for all discharge needs, 1 to 1 time with Social worker, Explore available resources and support systems, Assess for adequacy in community support network, Educate family and significant other(s) on suicide prevention, Complete Psychosocial Assessment, Interpersonal group therapy.  Evaluation of Outcomes: Progressing   Progress in Treatment: Attending groups: Yes. Participating in groups: Yes. Taking medication as prescribed:  Yes. Toleration medication: Yes. Family/Significant other contact made: No, will contact:  grandparents Patient understands diagnosis: Yes. Discussing patient identified problems/goals with staff: Yes. Medical problems stabilized or resolved: Yes. Denies suicidal/homicidal ideation: Yes. Issues/concerns per patient self-inventory: No.   New problem(s) identified: No, Describe:  none  New Short Term/Long Term Goal(s): detox, medication management for mood stabilization; elimination of SI thoughts; development of comprehensive mental wellness/sobriety plan  Patient Goals:  Did not attend  Discharge Plan or Barriers: Pt is to return to live with grandparents and is to follow up with medication management and therapy through [facility].  Reason for Continuation of Hospitalization: Depression Medication stabilization Suicidal ideation  Estimated Length of Stay: 3-5 days  Attendees: Patient: Did not attend 09/15/2020   Physician:  09/15/2020   Nursing:  09/15/2020   RN Care Manager: 09/15/2020   Social Worker: Ruthann Cancer, LCSW 09/15/2020   Recreational Therapist:  09/15/2020   Other:  09/15/2020   Other:  09/15/2020   Other: 09/15/2020       Scribe for Treatment Team: Otelia Santee, LCSW 09/15/2020 11:01 AM

## 2020-09-15 NOTE — [facility] Group Notes (Signed)
Pt attended wrap-up group. Pt rated day 7.5 due to achieving his daily goal.

## 2020-09-15 NOTE — [facility] Group Notes (Signed)
Adult Psychoeducational Group Note  Date:  09/15/2020 Time:  11:02 AM  Group Topic/Focus:  Personal Choices and Values:   The focus of this group is to help patients assess and explore the importance of values in their lives, how their values affect their decisions, how they express their values and what opposes their expression.  Participation Level:  Active  Participation Quality:  Appropriate and Attentive  Affect:  Appropriate  Cognitive:  Alert and Appropriate  Insight: Appropriate and Good  Engagement in Group:  Engaged  Modes of Intervention:  Discussion  Additional Comments:  Pt attended morning goals group.  Deforest Hoyles Jaiden Wahab 09/15/2020, 11:02 AM

## 2020-09-15 NOTE — Progress Notes (Signed)
Recreation Therapy Notes  Date: 5.11.22 Time: 0930 Location: [address] Dayroom  Group Topic: Stress Management   Goal Area(s) Addresses:  Patient will actively participate in stress management techniques presented during session.  Patient will successfully identify benefit of practicing stress management post d/c.   Behavioral Response: Appropriate  Intervention: Guided exercise with ambient sound and script  Activity :Guided Imagery  LRT read a script that focused enjoying the sights and sounds of being in a wildlife sanctuary.  Patients were to listen, focus on their breathing and relax to follow along as script was being read.  Education:  Stress Management, Discharge Planning.   Education Outcome: Acknowledges education  Clinical Observations/Feedback: Patient actively engaged in technique introduced, expressed no concerns.     Julian Clarke, LRT/CTRS        Julian Clarke A 09/15/2020 10:46 AM 

## 2020-09-15 NOTE — Progress Notes (Signed)
   09/15/20 0500  Sleep  Number of Hours 6.25

## 2020-09-15 NOTE — Progress Notes (Signed)
Pt visible on the unit this evening. Pt stated he was feeling better. Pt given PRN Trazodone and Vistaril per [facility]    09/15/20 2300  Psych Admission Type (Psych Patients Only)  Admission Status Involuntary  Psychosocial Assessment  Patient Complaints Depression  Eye Contact Fair  Facial Expression Fixed smile  Affect Anxious;Appropriate to circumstance;Depressed;Sad;Flat  Speech Logical/coherent  Interaction Minimal;Guarded  Motor Activity Other (Comment) (steady)  Appearance/Hygiene Unremarkable  Behavior Characteristics Anxious  Mood Anxious  Thought Process  Coherency WDL  Content WDL  Delusions None reported or observed  Perception WDL  Hallucination None reported or observed  Judgment Impaired  Confusion None  Danger to Self  Current suicidal ideation? Denies  Self-Injurious Behavior No self-injurious ideation or behavior indicators observed or expressed   Agreement Not to Harm Self Yes  Description of Agreement contracts for safety verbal  Danger to Others  Danger to Others None reported or observed

## 2020-09-15 NOTE — [facility] Group Notes (Signed)
LCSW Group Therapy Note  Type of Therapy/Topic: Group Therapy: Six Dimensions of Wellness  Participation Level: Active  Description of Group: This group will address the concept of wellness and the six concepts of wellness: occupational, physical, social, intellectual, spiritual, and emotional. Patients will be encouraged to process areas in their lives that are out of balance and identify reasons for remaining unbalanced. Patients will be encouraged to explore ways to practice healthy habits daily to attain better physical and mental health outcomes.  Therapeutic Goals: 1. Identify aspects of wellness that they are doing well. 2. Identify aspects of wellness that they would like to improve upon. 3. Identify one action they can take to improve an aspect of wellness in their lives.   Therapeutic Modalities: Cognitive Behavioral Therapy Solution-Focused Therapy Relapse Prevention  Natalie Leclaire, LCSWA Clinicial Social Worker [facility] 

## 2020-09-15 NOTE — Progress Notes (Addendum)
[facility] MD Progress Note  09/15/2020 7:13 AM Julian Clarke  MRN:  [id number]   Chief Complaint: suicide attempt  Subjective:  Julian Clarke is a 35 y.o. male with a history of ADHD, who was initially admitted for inpatient psychiatric hospitalization on 09/13/2020 for management of depression and anxiety, cocaine abuse, and recent suicide attempt via hanging. The patient is currently on Hospital Day 2.   Chart Review from last 24 hours:  The patient's chart was reviewed and nursing notes were reviewed. The patient's case was discussed in multidisciplinary team meeting. Per nursing, patient slept 6.25 hours. He attended groups and was visible in the milieu. No behavioral issues or safety concerns noted. Per [facility] he was compliant with scheduled medications except for not getting nicotine patch late in the evening.He required Vistaril X2 for anxiety and Trazodone X1 for sleep.  Information Obtained Today During Patient Interview: The patient was seen and evaluated on the unit. On assessment today the patient reports that his mood is improved and he has tolerated the start of medications. He reports good sleep and appetite. He denies SI, HI, AVH, or paranoia. He denies medication side-effects and voices no physical complaints. He denies cravings for drugs/ETOH or withdrawal symptoms. He is interested in outpatient SA resources after discharge but is not interested in residential treatment. He states the Neurontin and Lexapro have reduced his ruminations and anxiety.   Principal Problem: Depression, unspecified Diagnosis: Principal Problem:   Depression, unspecified Active Problems:   Suicidal behavior   Cocaine abuse (HCC)  Total Time Spent in Direct Patient Care:  I personally spent 30 minutes on the unit in direct patient care. The direct patient care time included face-to-face time with the patient, reviewing the patient's chart, communicating with other professionals, and coordinating care.  Greater than 50% of this time was spent in counseling or coordinating care with the patient regarding goals of hospitalization, psycho-education, and discharge planning needs.  Past Psychiatric History: see admission H&P  Past Medical History:  Past Medical History:  Diagnosis Date  . Anxiety   . Depression   . Hypertension     Family History: see admission H&P  Family Psychiatric  History: see admission H&P  Social History:  Social History   Substance and Sexual Activity  Alcohol Use Yes  . Alcohol/week: 1.0 standard drink  . Types: 1 Cans of beer per week   Comment: one beer once every other week     Social History   Substance and Sexual Activity  Drug Use Yes  . Frequency: 5.0 times per week  . Types: Marijuana, Cocaine, "Crack" cocaine   Comment: cocaine, THC throughout the week    Social History   Socioeconomic History  . Marital status: Married    Spouse name: Not on file  . Number of children: Not on file  . Years of education: Not on file  . Highest education level: Not on file  Occupational History  . Not on file  Tobacco Use  . Smoking status: Current Every Day Smoker    Years: 1.00    Types: Cigarettes  . Smokeless tobacco: Never Used  . Tobacco comment: one pack daily  Vaping Use  . Vaping Use: Every day  Substance and Sexual Activity  . Alcohol use: Yes    Alcohol/week: 1.0 standard drink    Types: 1 Cans of beer per week    Comment: one beer once every other week  . Drug use: Yes    Frequency: 5.0 times  per week    Types: Marijuana, Cocaine, "Crack" cocaine    Comment: cocaine, THC throughout the week  . Sexual activity: Yes  Other Topics Concern  . Not on file  Social History Narrative  . Not on file   Social Determinants of Health   Financial Resource Strain: Not on file  Food Insecurity: Not on file  Transportation Needs: Not on file  Physical Activity: Not on file  Stress: Not on file  Social Connections: Not on file    Additional Social History:    Pain Medications: see MAR Prescriptions: see MAR Over the Counter: see MAR History of alcohol / drug use?: Yes Longest period of sobriety (when/how long): unsure Negative Consequences of Use: Financial,Personal relationships,Work / School Withdrawal Symptoms: Other (Comment) (denied withdrawals) Name of Substance 1: alcohol 1 - Age of First Use: 35 yrs old 1 - Amount (size/oz): one beer every other week 1 - Frequency: every other week 1 - Duration: since age of 35 yrs old 1 - Last Use / Amount: Saturday 1 - Method of Aquiring: buying 1- Route of Use: drinks beer Name of Substance 2: cocaine 2 - Age of First Use: 35 yrs old 2 - Amount (size/oz): 1 gram to 3 grams daily 2 - Frequency: daily 2 - Duration: since age of 35 yrs old 2 - Last Use / Amount: Saturday 2 - Method of Aquiring: buying 2 - Route of Substance Use: snort adn smoke Name of Substance 3: THC 3 - Age of First Use: 35 yrs old 3 - Amount (size/oz): uses every other month 3 - Frequency: every other month 3 - Duration: since 35 yrs old 3 - Last Use / Amount: Saturday morning 3 - Method of Aquiring: buys 3 - Route of Substance Use: smokes   Sleep: Good  Appetite:  Good  Current Medications: Current Facility-Administered Medications  Medication Dose Route Frequency Provider Last Rate Last Admin  . acetaminophen (TYLENOL) tablet 650 mg  650 mg Oral Q6H PRN Antonieta Pert, MD      . alum & mag hydroxide-simeth (MAALOX/MYLANTA) 200-200-20 MG/5ML suspension 30 mL  30 mL Oral Q4H PRN Antonieta Pert, MD      . escitalopram (LEXAPRO) tablet 10 mg  10 mg Oral Daily Bartholomew Crews E, MD   10 mg at 09/14/20 1540  . gabapentin (NEURONTIN) capsule 100 mg  100 mg Oral TID Bartholomew Crews E, MD   100 mg at 09/14/20 1649  . hydrOXYzine (ATARAX/VISTARIL) tablet 25 mg  25 mg Oral Q6H PRN Comer Locket, MD   25 mg at 09/14/20 2141  . loperamide (IMODIUM) capsule 2-4 mg  2-4 mg Oral PRN  Mason Jim, Pryce Folts E, MD      . LORazepam (ATIVAN) tablet 1 mg  1 mg Oral Q6H PRN Comer Locket, MD      . multivitamin with minerals tablet 1 tablet  1 tablet Oral Daily Comer Locket, MD   1 tablet at 09/14/20 1540  . nicotine (NICODERM CQ - dosed in mg/24 hours) patch 21 mg  21 mg Transdermal Daily Rachal Dvorsky E, MD      . ondansetron (ZOFRAN-ODT) disintegrating tablet 4 mg  4 mg Oral Q6H PRN Mason Jim, Zyire Eidson E, MD      . thiamine tablet 100 mg  100 mg Oral Daily Shantai Tiedeman E, MD      . traZODone (DESYREL) tablet 50 mg  50 mg Oral QHS PRN Antonieta Pert, MD   50  mg at 09/14/20 2141    Lab Results:  Results for orders placed or performed during the hospital encounter of 09/13/20 (from the past 48 hour(s))  CBC     Status: None   Collection Time: 09/13/20  6:08 PM  Result Value Ref Range   WBC 6.4 4.0 - 10.5 K/uL   RBC 4.33 4.22 - 5.81 MIL/uL   Hemoglobin 13.7 13.0 - 17.0 g/dL   HCT 96.2 22.9 - 79.8 %   MCV 95.4 80.0 - 100.0 fL   MCH 31.6 26.0 - 34.0 pg   MCHC 33.2 30.0 - 36.0 g/dL   RDW 92.1 19.4 - 17.4 %   Platelets 242 150 - 400 K/uL   nRBC 0.0 0.0 - 0.2 %    Comment: Performed at [facility], [address], [place], [place] [address]  Comprehensive metabolic panel     Status: Abnormal   Collection Time: 09/13/20  6:08 PM  Result Value Ref Range   Sodium 137 135 - 145 mmol/L   Potassium 3.9 3.5 - 5.1 mmol/L   Chloride 105 98 - 111 mmol/L   CO2 28 22 - 32 mmol/L   Glucose, Bld 141 (H) 70 - 99 mg/dL    Comment: Glucose reference range applies only to samples taken after fasting for at least 8 hours.   BUN 17 6 - 20 mg/dL   Creatinine, Ser 8.18 0.61 - 1.24 mg/dL   Calcium 9.3 8.9 - 56.3 mg/dL   Total Protein 7.3 6.5 - 8.1 g/dL   Albumin 4.0 3.5 - 5.0 g/dL   AST 32 15 - 41 U/L   ALT 33 0 - 44 U/L   Alkaline Phosphatase 36 (L) 38 - 126 U/L   Total Bilirubin 0.6 0.3 - 1.2 mg/dL   GFR, Estimated >14 >97 mL/min    Comment: (NOTE) Calculated  using the CKD-EPI Creatinine Equation (2021)    Anion gap 4 (L) 5 - 15    Comment: Performed at [facility], [address], [place], [place] [address]  TSH     Status: None   Collection Time: 09/13/20  6:08 PM  Result Value Ref Range   TSH 2.170 0.350 - 4.500 uIU/mL    Comment: Performed by a 3rd Generation assay with a functional sensitivity of <=0.01 uIU/mL. Performed at [facility], [address], [place], [place] [address]     Blood Alcohol level:  No results found for: [facility]  Metabolic Disorder Labs: No results found for: HGBA1C, MPG No results found for: PROLACTIN No results found for: CHOL, TRIG, HDL, CHOLHDL, VLDL, LDLCALC  Physical Findings: AIMS: Facial and Oral Movements Muscles of Facial Expression: None, normal Lips and Perioral Area: None, normal Jaw: None, normal Tongue: None, normal,Extremity Movements Upper (arms, wrists, hands, fingers): None, normal Lower (legs, knees, ankles, toes): None, normal, Trunk Movements Neck, shoulders, hips: None, normal, Overall Severity Severity of abnormal movements (highest score from questions above): None, normal Incapacitation due to abnormal movements: None, normal Patient's awareness of abnormal movements (rate only patient's report): No Awareness, Dental Status Current problems with teeth and/or dentures?: No Does patient usually wear dentures?: No  CIWA:  CIWA-Ar Total: 2 COWS:  COWS Total Score: 2  Musculoskeletal: Strength & Muscle Tone: within normal limits Gait & Station: normal, steady Patient leans: N/A  Psychiatric Specialty Exam: Physical Exam Vitals reviewed.  HENT:     Head: Normocephalic.  Pulmonary:     Effort: Pulmonary effort is normal.  Neurological:     Mental Status: He is alert.     Review of Systems  Respiratory: Negative for shortness of breath.   Cardiovascular: Negative for chest pain.  Gastrointestinal: Negative for nausea and vomiting.     Blood pressure 139/77, pulse 89, temperature 98.4 F (36.9 C), temperature source Oral, resp. rate 18, height  (1.676 m), weight 118.8 kg, SpO2 100 %.Body mass index is 42.29 kg/m.  General Appearance: casually dressed, fair hygiene  Eye Contact:  Good  Speech:  Clear and Coherent and Normal Rate  Volume:  Normal  Mood:  Described as good - appears more euthymic  Affect:  less constricted, brighter  Thought Process:  Goal Directed and Linear  Orientation:  Full (Time, Place, and Person)  Thought Content:  Logical and no evidence of acute psychosis, delusions, or paranoia on exam  Suicidal Thoughts:  No  Homicidal Thoughts:  No  Memory:  Recent;   Fair  Judgement:  Fair  Insight:  Fair  Psychomotor Activity:  Normal  Concentration:  Concentration: Good and Attention Span: Good  Recall:  Fair  Fund of Knowledge:  Good  Language:  Good  Akathisia:  Negative  Assets:  Communication Skills Desire for Improvement Resilience Social Support  ADL's:  Intact  Cognition:  WNL  Sleep:  Number of Hours: 6.25   Treatment Plan Summary: Diagnoses / Active Problems: Unspecified depressive d/o (r/o MDD, r/o substance induced depressive d/o, r/o adjustment d/o with mixed mood) ADHD by hx 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 Safety and Monitoring:             -- Involuntary admission to inpatient psychiatric unit for safety, stabilization and treatment             -- Daily contact with patient to assess and evaluate symptoms and progress in treatment             -- Patient's case to be discussed in multi-disciplinary team meeting             -- Observation Level : q15 minute checks             -- Vital signs:  q12 hours             -- Precautions: suicide  2. Psychiatric Diagnoses and Treatment:              Unspecified depressive d/o (r/o MDD, r/o substance induced  depressive d/o, r/o adjustment d/o with mixed mood)             ADHD by hx -- Continue Lexapro  daily -- Continue Neurontin  tid to help with mood, anxiety, and potential withdrawal/cravings -- Vistaril  q6 hours PRN anxiety -- Trazodone  po qhs PRN insomnia              -- Encouraged patient to participate in unit milieu and in scheduled group therapies              -- Short Term Goals: Ability to identify changes in lifestyle to reduce recurrence of condition will improve, Ability to demonstrate self-control will improve and Ability to identify and develop effective coping behaviors will improve             -- Long Term Goals: Improvement in symptoms so as ready for discharge  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             Cannabis use episodic (r/o cannabis use d/o)             R/o alcohol use d/o             -- UDS positive for cocaine and marijuana - patient counseled on the need to abstain from alcohol and illicit substances after discharge; no ETOH level obtained on admission             -- He is interested in outpatient SA treatment after discharge and declines residential rehab             -- Continue CIWA protocol with Ativan 1mg  for CIWA score >10 and oral thiamine and MVI replacement (recent CIWA scores: 1,2,2)             -- Continue Neurontin 100mg  tid to help with potential withdrawal/cravings and anxiety             -- Short Term Goals: Ability to identify triggers associated with substance abuse/mental health issues will improve             -- Long Term Goals: Improvement in symptoms so as ready for discharge              3. Medical Issues Being Addressed:              Tobacco Use Disorder             -- Nicotine patch 21mg /24 hours ordered             -- Smoking cessation encouraged              Questionable h/o HTN             -- will monitor BP               Admission labs pending: repeat CMP and HbgA1c pending; EKG shows  NSR 81bpm with QTc  4. Discharge Planning:              -- Social work and case management to assist with discharge planning and identification of hospital follow-up needs prior to discharge             -- Estimated LOS: 1-2 days             -- Discharge Concerns: Need to establish a safety plan; Medication compliance and effectiveness             -- Discharge Goals: Return home with outpatient referrals for mental health follow-up including medication management/psychotherapy  I certify that inpatient services furnished can reasonably be expected to improve the patient's condition.     , MD, FAPA 09/15/2020, 7:13 AM

## 2020-09-15 NOTE — Plan of Care (Signed)
Nurse discussed coping skills with patient.  

## 2020-09-15 NOTE — Progress Notes (Signed)
D:  Patient's self inventory sheet, patient sleeps good, sleep medication helpful.  Good appetite, normal energy level, good concentration.  Rated depression 3, denied hopeless and anxiety.  Denied withdrawals.  Denied SI.  Denied physical problems.  Denied physical pain.  Goal is discharge.  Does have discharge plans. A:  Medications administered per MD orders.  Emotional support and encouragement given patient. R:  Safety maintained with 15 minute checks.  Denied SI and HI, contracts for safety.  Denied A/V hallucinations.  

## 2020-09-16 DIAGNOSIS — F1420 Cocaine dependence, uncomplicated: Secondary | ICD-10-CM

## 2020-09-16 DIAGNOSIS — F322 Major depressive disorder, single episode, severe without psychotic features: Principal | ICD-10-CM

## 2020-09-16 MED ORDER — TRAZODONE HCL 50 MG PO TABS: 50.0000 mg | ORAL_TABLET | Freq: Every evening | ORAL | 0 refills | Status: DC | PRN

## 2020-09-16 MED ORDER — HYDROXYZINE HCL 25 MG PO TABS: 25.0000 mg | ORAL_TABLET | Freq: Four times a day (QID) | ORAL | 0 refills | Status: DC | PRN

## 2020-09-16 MED ORDER — ESCITALOPRAM OXALATE 10 MG PO TABS: 10.0000 mg | ORAL_TABLET | Freq: Every day | ORAL | 0 refills | Status: DC

## 2020-09-16 MED ORDER — NICOTINE 21 MG/24HR TD PT24: 21.0000 mg | MEDICATED_PATCH | Freq: Every day | TRANSDERMAL | 0 refills | Status: DC

## 2020-09-16 MED ORDER — GABAPENTIN 100 MG PO CAPS: 100.0000 mg | ORAL_CAPSULE | Freq: Three times a day (TID) | ORAL | 0 refills | Status: DC

## 2020-09-16 NOTE — Progress Notes (Signed)
Discharge Note:  Patient denies SI/HI AVH at this time. Discharge instructions, AVS, prescriptions and transition record gone over with patient. Patient agrees to comply with medication management, follow-up visit, and outpatient therapy. Patient belongings returned to patient. Patient questions and concerns addressed and answered.  Patient ambulatory off unit.  Patient discharged to home with friend.   

## 2020-09-16 NOTE — [facility] Suicide Risk Assessment (Signed)
[facility] INPATIENT:  Family/Significant Other Suicide Prevention Education  Suicide Prevention Education:  Education Completed; Elan Brainerd (grandma) [phone],  (name of family member/significant other) has been identified by the patient as the family member/significant other with whom the patient will be residing, and identified as the person(s) who will aid the patient in the event of a mental health crisis (suicidal ideations/suicide attempt).  With written consent from the patient, the family member/significant other has been provided the following suicide prevention education, prior to the and/or following the discharge of the patient.  The suicide prevention education provided includes the following:  Suicide risk factors  Suicide prevention and interventions  National Suicide Hotline telephone number  [facility] assessment telephone number  [facility] Emergency Assistance 911  [facility] and/or Residential Mobile Crisis Unit telephone number  Request made of family/significant other to:  Remove weapons (e.g., guns, rifles, knives), all items previously/currently identified as safety concern.    Remove drugs/medications (over-the-counter, prescriptions, illicit drugs), all items previously/currently identified as a safety concern.  The family member/significant other verbalizes understanding of the suicide prevention education information provided.  The family member/significant other agrees to remove the items of safety concern listed above.  "A friend said she saw on facebook where he said he was going to kill himself. Then we got a knock at the door and the police had him in the car. He has been going through some stuff with his girlfriend." Can return to grandparents at discharge. No weapons in the home. No safety concerns.   [name] 09/16/2020, 10:37 AM

## 2020-09-16 NOTE — [facility] Suicide Risk Assessment (Signed)
[facility] Discharge Suicide Risk Assessment   Principal Problem: Depression, unspecified Discharge Diagnoses: Principal Problem:   Depression, unspecified Active Problems:   Suicidal behavior   Cocaine abuse ([place])  Total Time Spent in Direct Patient Care:  I personally spent 30 minutes on the unit in direct patient care. The direct patient care time included face-to-face time with the patient, reviewing the patient's chart, communicating with other professionals, and coordinating care. Greater than 50% of this time was spent in counseling or coordinating care with the patient regarding goals of hospitalization, psycho-education, and discharge planning needs.  Subjective: Patient seen on rounds in presence of social work. Per nursing, the patient had no behavioral issues or safety concerns yesterday and attended groups. On exam the patient denies SI, HI, AVH, paranoia, or delusions. He reports stable sleep and appetite and denies medication side-effects. He voices no physical complaints. Safety planning was reviewed and aftercare plans were discussed. He was encouraged to start outpatient substance abuse treatment along with mental health treatment as scheduled. He denies current cravings or signs of withdrawal from cocaine or alcohol. Recent CIWA scores 1,1,0. He was advised that his alkaline phosphatase is slightly low and should be monitored as an outpatient with a PCP after discharge. Time was given for questions.  Labs: HbgA1c 5.3; CMP WNL except for glucose 161 and low alk phos of 35  Musculoskeletal: Strength & Muscle Tone: within normal limits Gait & Station: normal, steady Patient leans: N/A  Psychiatric Specialty Exam: Physical Exam Vitals reviewed.  HENT:     Head: Normocephalic.  Pulmonary:     Effort: Pulmonary effort is normal.  Neurological:     General: No focal deficit present.     Mental Status: He is alert.     Review of Systems  Respiratory: Negative for shortness of  breath.   Cardiovascular: Negative for chest pain.  Gastrointestinal: Negative for nausea and vomiting.    Blood pressure 126/90, pulse 98, temperature 98.3 F (36.8 C), temperature source Oral, resp. rate 18, height 5' 6"  (1.676 m), weight 118.8 kg, SpO2 100 %.Body mass index is 42.29 kg/m.  General Appearance: casually dressed, adequate hygiene, well engaged with examiner  Eye Contact:  Good  Speech:  Clear and Coherent and Normal Rate  Volume:  Normal  Mood:  Euthymic  Affect:  moderate, stable, full  Thought Process:  Goal Directed and Linear  Orientation:  Full (Time, Place, and Person)  Thought Content:  Logical and no evidence of acute psychosis, paranoia, or delusions on exam  Suicidal Thoughts:  No  Homicidal Thoughts:  No  Memory:  good  Judgement:  Fair  Insight:  Fair  Psychomotor Activity:  Normal  Concentration:  Concentration: Good and Attention Span: Good  Recall:  good  Fund of Knowledge:  Good  Language:  Good  Akathisia:  Negative  Assets:  Communication Skills Desire for Improvement Housing Physical Health Resilience Social Support  ADL's:  Intact  Cognition:  WNL  Sleep:  Number of Hours: 6.75   Mental Status Per Nursing Assessment::   On Admission:  Suicidal ideation indicated by patient,Suicide plan,Self-harm behaviors  Demographic Factors:  Male  Loss Factors: Loss of significant relationship  Historical Factors: Family history of mental illness or substance abuse  Risk Reduction Factors:   Responsible for children under 52 years of age, Sense of responsibility to family, Employed, Living with another person, especially a relative and Positive social support  Continued Clinical Symptoms:  Depression dx; substance abuse prior to  admission  Cognitive Features That Contribute To Risk:  None    Suicide Risk:  Minimal: No identifiable suicidal ideation.  Patients presenting with no risk factors but with morbid ruminations; may be  classified as minimal risk based on the severity of the depressive symptoms   Follow-up [place]. Go on 09/21/2020.   Specialty: Behavioral Health Why: You have an appointment for therapy services on 09/21/20 at 7:45 am.  You also have an appointment for medication management on 10/06/20 at 7:45 am.  These appointments will be held in person and are first come, first served.  Contact information: [place] [phone]       [facility]. Call.   Why: Please contact this provider personally to schedule an appointment for bereavement/grief counseling. Contact information: [place] [phone]              Plan Of Care/Follow-up recommendations:  Activity:  as tolerated Diet:  heart healthy Other:  Patient advised to keep scheduled outpatient mental health appointments and to start substance abuse counseling as an outpatient. He was advised to abstain from illict substance and alcohol use after discharge. He was advised to see a primary care provider for recheck of his low alkaline phosphatase and recheck of his blood pressure without fail after discharge. Compliance with medications was encouraged.   Harlow Asa, MD, FAPA 09/16/2020, 10:00 AM

## 2020-09-16 NOTE — Progress Notes (Signed)
  [facility] Adult Case Management Discharge Plan :  Will you be returning to the same living situation after discharge:  Yes,  grandparents At discharge, do you have transportation home?: Yes,  grandparents Do you have the ability to pay for your medications: Yes,  has income  Release of information consent forms completed and in the chart;  Patient's signature needed at discharge.  Patient to Follow up at:  Follow-up Information    [facility]. Go on 09/21/2020.   Specialty: Behavioral Health Why: You have an appointment for therapy services on 09/21/20 at 7:45 am.  You also have an appointment for medication management on 10/06/20 at 7:45 am.  These appointments will be held in person and are first come, first served.  Contact information: [address] [place] [address] [phone]       [facility]. Call.   Why: Please contact this provider personally to schedule an appointment for bereavement/grief counseling. Contact information: [address] [place] [address] [phone]              Next level of care provider has access to [facility] Link:yes  Safety Planning and Suicide Prevention discussed: Yes,  with grndma  Have you used any form of tobacco in the last 30 days? (Cigarettes, Smokeless Tobacco, Cigars, and/or Pipes): Yes  Has patient been referred to the Quitline?: Patient refused referral  Patient has been referred for addiction treatment: Yes  [name], LCSWA 09/16/2020, 10:39 AM

## 2020-09-16 NOTE — Discharge Summary (Signed)
Physician Discharge Summary Note  Patient:  Julian Clarke is an 35 y.o., male MRN:  [id number] DOB:  Sep 08, 1985 Patient phone:  [phone] (home)  Patient address:   [address] [place] [address],  Total Time spent with patient: Greater than 30 minutes  Date of Admission:  09/13/2020  Date of Discharge: 09-16-20  Reason for Admission: Suicide attempt by hanging.  Principal Problem: Major depressive disorder, single episode, severe (HCC)  Discharge Diagnoses: Principal Problem:   Major depressive disorder, single episode, severe (HCC) Active Problems:   Cocaine use disorder, severe, dependence (HCC)   Suicidal behavior   Depression, unspecified   Cocaine abuse (HCC)  Past Psychiatric History: Major depressive disorder  Past Medical History:  Past Medical History:  Diagnosis Date  . Anxiety   . Depression   . Hypertension    History reviewed. No pertinent surgical history. Family History:  Family History  Family history unknown: Yes   Family Psychiatric  History: See H&P  Social History:  Social History   Substance and Sexual Activity  Alcohol Use Yes  . Alcohol/week: 1.0 standard drink  . Types: 1 Cans of beer per week   Comment: one beer once every other week     Social History   Substance and Sexual Activity  Drug Use Yes  . Frequency: 5.0 times per week  . Types: Marijuana, Cocaine, "Crack" cocaine   Comment: cocaine, THC throughout the week    Social History   Socioeconomic History  . Marital status: Married    Spouse name: Not on file  . Number of children: Not on file  . Years of education: Not on file  . Highest education level: Not on file  Occupational History  . Not on file  Tobacco Use  . Smoking status: Current Every Day Smoker    Years: 1.00    Types: Cigarettes  . Smokeless tobacco: Never Used  . Tobacco comment: one pack daily  Vaping Use  . Vaping Use: Every day  Substance and Sexual Activity  . Alcohol use: Yes     Alcohol/week: 1.0 standard drink    Types: 1 Cans of beer per week    Comment: one beer once every other week  . Drug use: Yes    Frequency: 5.0 times per week    Types: Marijuana, Cocaine, "Crack" cocaine    Comment: cocaine, THC throughout the week  . Sexual activity: Yes  Other Topics Concern  . Not on file  Social History Narrative  . Not on file   Social Determinants of Health   Financial Resource Strain: Not on file  Food Insecurity: Not on file  Transportation Needs: Not on file  Physical Activity: Not on file  Stress: Not on file  Social Connections: Not on file   Hospital Course: (Per Md's admission evaluation notes): 34y/o male with self-reported past psychiatric history significant for ADHD in childhood, who was admitted under IVC for suicide attempt via hanging. The patient states that he relapsed with cocaine use on Friday, and states his long-time girlfriend told him not to come home and broke up with him on Saturday in the context of his substance use. He states that on Sunday he started feeling hopeless and was disappointed in himself regarding his relapse with cocaine which he feels impulsively led to his suicide attempt. He admits he was also "trying to get attention" from his ex-girlfriend with his suicide attempt. He reports that he decided to post his suicide attempt on Facebook  live in an effort to "leave a message" about what he felt were "responsibilities and expectations for life." He describes tying a noose out of a drop cord and states he was in the process of putting it around his neck when the police arrived. He assumes that his ex-girlfriend saw his Facebook post and called the police who took him to the hospital under IVC.   Prior to this discharge, Julian Clarke was seen & evaluated for mental health stability. The current laboratory findings were reviewed (stable), nurses notes & vital signs were reviewed as well. There are no current mental health or medical  issues that should prevent this discharge at this time. Patient is being discharged to continue mental health care as noted below.   This is Julian Clarke's first psychiatric admission/discharge summary from this [facility]. He was admitted to the [facility] under IVC petition for evaluation & treatment after a suicide attempt by hanging. He was recommended for mood stabilization treatments by his treatment team after his admission evaluation. And with his consent, he received, stabilized & was discharged on the medications as listed below on his discharge medication lists. He was also enrolled & participated in the group counseling sessions being offered & held on this unit. He learned coping skills. He presented no other significant pre-existing medical conditions that required treatment or monitoring. He tolerated his treatment regimen without any adverse effects or reactions reported. Julian Clarke's symptoms responded well to his treatment regimen warranting this discharge.  During the course of his hospitalization, the 15-minute checks were adequate to ensure Julian Clarke's safety. Patient did not display any dangerous, violent or suicidal behavior on the unit.  He interacted with patients & staff appropriately. He participated appropriately in the group sessions/therapies. His medications were addressed & adjusted to meet his needs. He was recommended for an outpatient follow-up care & medication management upon discharge to assure his continuity of care.  At the time of discharge, patient is not reporting any acute suicidal/homicidal ideations. He currently denies any new issues or concerns. Education and supportive counseling provided throughout her hospital stay & upon discharge.   Today upon his discharge evaluation with the attending psychiatrist, Julian Clarke shares he is doing & feeling a lot better. He denies any other specific concerns. He is sleeping well. His appetite is good. He denies other physical complaints. He denies  AH/VH, delusional thoughts or paranoia. He does not appear as if responding to any internal stimuli. He feels that his medications have been helpful & is in agreement to continue his current treatment regimen as recommended. He was able to engage in safety planning including plan to return to [facility] or contact emergency services if he feels unable to maintain his own safety or the safety of others. Pt had no further questions, comments, or concerns. He left [facility] with all personal belongings in no apparent distress. Transportation per his family (grandparents).  Physical Findings: AIMS: Facial and Oral Movements Muscles of Facial Expression: None, normal Lips and Perioral Area: None, normal Jaw: None, normal Tongue: None, normal,Extremity Movements Upper (arms, wrists, hands, fingers): None, normal Lower (legs, knees, ankles, toes): None, normal, Trunk Movements Neck, shoulders, hips: None, normal, Overall Severity Severity of abnormal movements (highest score from questions above): None, normal Incapacitation due to abnormal movements: None, normal Patient's awareness of abnormal movements (rate only patient's report): No Awareness, Dental Status Current problems with teeth and/or dentures?: No Does patient usually wear dentures?: No  CIWA:  CIWA-Ar Total: 0 COWS:  COWS Total Score: 2  Musculoskeletal: Strength & Muscle Tone: within normal limits Gait & Station: normal Patient leans: N/A  Psychiatric Specialty Exam:  Presentation  General Appearance: Appropriate for Environment; Casual; Fairly Groomed  Eye Contact:Good  Speech:Normal Rate  Speech Volume:Normal  Handedness:Right  Mood and Affect  Mood:Euthymic  Affect:Appropriate; Congruent  Thought Process  Thought Processes:Coherent; Goal Directed; Linear  Descriptions of Associations:Intact  Orientation:Full (Time, Place and Person)  Thought Content:Logical  History of Schizophrenia/Schizoaffective  disorder:No  Duration of Psychotic Symptoms:No data recorded Hallucinations:Hallucinations: None  Ideas of Reference:None  Suicidal Thoughts:Suicidal Thoughts: No SI Active Intent and/or Plan: Without Intent; Without Plan; Without Means to Carry Out; Without Access to Means  Homicidal Thoughts:Homicidal Thoughts: No  Sensorium  Memory:Immediate Good; Recent Good; Remote Good  Judgment:Good  Insight:Good  Executive Functions  Concentration:Good  Attention Span:Good  Recall:Good  Fund of Knowledge:Good  Language:Good  Psychomotor Activity  Psychomotor Activity:Psychomotor Activity: Normal  Assets  Assets:Communication Skills; Desire for Improvement; Physical Health; Resilience  Sleep  Sleep:Sleep: Good Number of Hours of Sleep: 7.5  Physical Exam: Physical Exam Vitals and nursing note reviewed.  HENT:     Head: Normocephalic.     Nose: Nose normal.     Mouth/Throat:     Pharynx: Oropharynx is clear.  Eyes:     Pupils: Pupils are equal, round, and reactive to light.  Cardiovascular:     Rate and Rhythm: Normal rate.     Pulses: Normal pulses.  Pulmonary:     Effort: Pulmonary effort is normal.  Genitourinary:    Comments: Deferred Musculoskeletal:        General: Normal range of motion.     Cervical back: Normal range of motion.  Skin:    General: Skin is warm and dry.  Neurological:     General: No focal deficit present.     Mental Status: He is alert and oriented to person, place, and time. Mental status is at baseline.    Review of Systems  Constitutional: Negative.   HENT: Negative.   Eyes: Negative.   Respiratory: Negative.   Cardiovascular: Negative.   Gastrointestinal: Negative.   Genitourinary: Negative.   Musculoskeletal: Negative.   Skin: Negative.   Neurological: Negative.   Endo/Heme/Allergies:       Allergies: NKDA  Psychiatric/Behavioral: Positive for depression (Hx of (Stable on medication)) and substance abuse (Hx of  (Amphetamine, cocaine & THC use disorders)). Negative for hallucinations and suicidal ideas. The patient has insomnia (Hx of (stable on medication)). The patient is not nervous/anxious (Stable on upon discharge).    Blood pressure 126/90, pulse 98, temperature 98.3 F (36.8 C), temperature source Oral, resp. rate 18, height 5\' 6"  (1.676 m), weight 118.8 kg, SpO2 100 %. Body mass index is 42.29 kg/m.   Have you used any form of tobacco in the last 30 days? (Cigarettes, Smokeless Tobacco, Cigars, and/or Pipes): Yes  Has this patient used any form of tobacco in the last 30 days? (Cigarettes, Smokeless Tobacco, Cigars, and/or Pipes): Yes, an FDA-approved tobacco cessation medication was recommended at discharge.  Blood Alcohol level:  No results found for: [facility]  Metabolic Disorder Labs:  Lab Results  Component Value Date   HGBA1C 5.3 09/15/2020   MPG 105.41 09/15/2020   No results found for: PROLACTIN No results found for: CHOL, TRIG, HDL, CHOLHDL, VLDL, LDLCALC  See Psychiatric Specialty Exam and Suicide Risk Assessment completed by Attending Physician prior to discharge.  Discharge destination:  Home  Is patient on multiple antipsychotic therapies at discharge:  No   Has Patient had three or more failed trials of antipsychotic monotherapy by history:  No  Recommended Plan for Multiple Antipsychotic Therapies: NA  Allergies as of 09/16/2020   No Known Allergies     Medication List    TAKE these medications     Indication  escitalopram 10 MG tablet Commonly known as: LEXAPRO Take 1 tablet (10 mg total) by mouth daily. For depression Start taking on: Sep 17, 2020  Indication: Generalized Anxiety Disorder, Major Depressive Disorder   gabapentin 100 MG capsule Commonly known as: NEURONTIN Take 1 capsule (100 mg total) by mouth 3 (three) times daily. For agitation  Indication: Agitation   hydrOXYzine 25 MG tablet Commonly known as: ATARAX/VISTARIL Take 1 tablet (25 mg  total) by mouth every 6 (six) hours as needed (anxiety).  Indication: Feeling Anxious   nicotine 21 mg/24hr patch Commonly known as: NICODERM CQ - dosed in mg/24 hours Place 1 patch (21 mg total) onto the skin daily. (May buy from over the counter): For smoking cessation Start taking on: Sep 17, 2020  Indication: Nicotine Addiction   traZODone 50 MG tablet Commonly known as: DESYREL Take 1 tablet (50 mg total) by mouth at bedtime as needed for sleep.  Indication: Trouble Sleeping       Follow-up Information    [facility]. Go on 09/21/2020.   Specialty: Behavioral Health Why: You have an appointment for therapy services on 09/21/20 at 7:45 am.  You also have an appointment for medication management on 10/06/20 at 7:45 am.  These appointments will be held in person and are first come, first served.  Contact information: [address] [place] [phone]       [facility]. Call.   Why: Please contact this provider personally to schedule an appointment for bereavement/grief counseling. Contact information: [address] [place] [phone]             Follow-up recommendations: Activity:  As tolerated Diet: As recommended by your primary care doctor. Keep all scheduled follow-up appointments as recommended.   Comments: Prescriptions given at discharge.  Patient agreeable to plan.  Given opportunity to ask questions.  Appears to feel comfortable with discharge denies any current suicidal or homicidal thought. Patient is also instructed prior to discharge to: Take all medications as prescribed by his/her mental healthcare provider. Report any adverse effects and or reactions from the medicines to his/her outpatient provider promptly. Patient has been instructed & cautioned: To not engage in alcohol and or illegal drug use while on prescription medicines. In the event of worsening symptoms,  patient is instructed to call the crisis hotline, 911 and or go to the nearest ED for appropriate evaluation and treatment of symptoms. To follow-up with his/her primary care provider for your other medical issues, concerns and or health care needs.  Signed: Armandina StammerAgnes Jurnee Nakayama, NP, pmhnp, fnp-bc 09/16/2020, 10:19 AM

## 2020-09-16 NOTE — Progress Notes (Signed)
   09/16/20 0500  Sleep  Number of Hours 6.75

## 2021-12-15 ENCOUNTER — Emergency Department (HOSPITAL_COMMUNITY): Payer: Self-pay

## 2021-12-15 ENCOUNTER — Observation Stay (HOSPITAL_COMMUNITY)
Admission: EM | Admit: 2021-12-15 | Discharge: 2021-12-16 | Disposition: A | Discharge status: Home / Self Care | Payer: Self-pay | Attending: Internal Medicine | Admitting: Internal Medicine

## 2021-12-15 DIAGNOSIS — Z79899 Other long term (current) drug therapy: Secondary | ICD-10-CM | POA: Insufficient documentation

## 2021-12-15 DIAGNOSIS — T50904A Poisoning by unspecified drugs, medicaments and biological substances, undetermined, initial encounter: Secondary | ICD-10-CM

## 2021-12-15 DIAGNOSIS — F1410 Cocaine abuse, uncomplicated: Secondary | ICD-10-CM | POA: Diagnosis present

## 2021-12-15 DIAGNOSIS — N179 Acute kidney failure, unspecified: Secondary | ICD-10-CM | POA: Insufficient documentation

## 2021-12-15 DIAGNOSIS — I10 Essential (primary) hypertension: Secondary | ICD-10-CM | POA: Insufficient documentation

## 2021-12-15 DIAGNOSIS — F17210 Nicotine dependence, cigarettes, uncomplicated: Secondary | ICD-10-CM | POA: Insufficient documentation

## 2021-12-15 DIAGNOSIS — G9340 Encephalopathy, unspecified: Principal | ICD-10-CM | POA: Diagnosis present

## 2021-12-15 LAB — CBC WITH DIFFERENTIAL/PLATELET
Abs Immature Granulocytes: 0.02 10*3/uL (ref 0.00–0.07)
Basophils Absolute: 0 10*3/uL (ref 0.0–0.1)
Basophils Relative: 0 %
Eosinophils Absolute: 0.1 10*3/uL (ref 0.0–0.5)
Eosinophils Relative: 1 %
HCT: 48.3 % (ref 39.0–52.0)
Hemoglobin: 15.6 g/dL (ref 13.0–17.0)
Immature Granulocytes: 0 %
Lymphocytes Relative: 15 %
Lymphs Abs: 1 10*3/uL (ref 0.7–4.0)
MCH: 31.8 pg (ref 26.0–34.0)
MCHC: 32.3 g/dL (ref 30.0–36.0)
MCV: 98.4 fL (ref 80.0–100.0)
Monocytes Absolute: 0.4 10*3/uL (ref 0.1–1.0)
Monocytes Relative: 5 %
Neutro Abs: 5.7 10*3/uL (ref 1.7–7.7)
Neutrophils Relative %: 79 %
Platelets: 216 10*3/uL (ref 150–400)
RBC: 4.91 MIL/uL (ref 4.22–5.81)
RDW: 11.8 % (ref 11.5–15.5)
WBC: 7.2 10*3/uL (ref 4.0–10.5)
nRBC: 0 % (ref 0.0–0.2)

## 2021-12-15 LAB — RAPID URINE DRUG SCREEN, HOSP PERFORMED
Amphetamines: NOT DETECTED
Barbiturates: NOT DETECTED
Benzodiazepines: NOT DETECTED
Cocaine: POSITIVE — AB
Opiates: NOT DETECTED
Tetrahydrocannabinol: NOT DETECTED

## 2021-12-15 LAB — COMPREHENSIVE METABOLIC PANEL
ALT: 16 U/L (ref 0–44)
AST: 22 U/L (ref 15–41)
Albumin: 4 g/dL (ref 3.5–5.0)
Alkaline Phosphatase: 31 U/L — ABNORMAL LOW (ref 38–126)
Anion gap: 8 (ref 5–15)
BUN: 22 mg/dL — ABNORMAL HIGH (ref 6–20)
CO2: 24 mmol/L (ref 22–32)
Calcium: 9.3 mg/dL (ref 8.9–10.3)
Chloride: 106 mmol/L (ref 98–111)
Creatinine, Ser: 1.31 mg/dL — ABNORMAL HIGH (ref 0.61–1.24)
GFR, Estimated: >60 mL/min (ref >60)
Glucose, Bld: 133 mg/dL — ABNORMAL HIGH (ref 70–99)
Potassium: 3.8 mmol/L (ref 3.5–5.1)
Sodium: 138 mmol/L (ref 135–145)
Total Bilirubin: 0.5 mg/dL (ref 0.3–1.2)
Total Protein: 6.9 g/dL (ref 6.5–8.1)

## 2021-12-15 LAB — SALICYLATE LEVEL: Salicylate Lvl: <7 mg/dL — ABNORMAL LOW (ref 7.0–30.0)

## 2021-12-15 LAB — ACETAMINOPHEN LEVEL: Acetaminophen (Tylenol), Serum: <10 ug/mL — ABNORMAL LOW (ref 10–30)

## 2021-12-15 LAB — ETHANOL: Alcohol, Ethyl (B): <10 mg/dL (ref <10)

## 2021-12-15 MED ORDER — NALOXONE HCL 0.4 MG/ML IJ SOLN: 0.4000 mg | Freq: Once | INTRAMUSCULAR | Status: AC

## 2021-12-15 MED ORDER — SODIUM CHLORIDE 0.9 % IV BOLUS
1000.0000 mL | Freq: Once | INTRAVENOUS | Status: AC
  Administered 2021-12-15: 1000 mL via INTRAVENOUS

## 2021-12-15 MED ORDER — NALOXONE HCL 0.4 MG/ML IJ SOLN
0.4000 mg | Freq: Once | INTRAMUSCULAR | Status: AC
  Administered 2021-12-15: 0.4 mg via INTRAVENOUS

## 2021-12-15 MED ORDER — NALOXONE HCL 0.4 MG/ML IJ SOLN
INTRAMUSCULAR | Status: AC
  Administered 2021-12-15: 0.4 mg via INTRAVENOUS
  Filled 2021-12-15: qty 2

## 2021-12-15 MED ORDER — NALOXONE HCL 0.4 MG/ML IJ SOLN
INTRAMUSCULAR | Status: AC
  Administered 2021-12-15: 0.4 mg via INTRAVENOUS
  Filled 2021-12-15: qty 1

## 2021-12-15 NOTE — ED Notes (Signed)
Pt breathing spontaneously however is still lethargic

## 2021-12-15 NOTE — ED Provider Notes (Signed)
[facility] EMERGENCY DEPARTMENT Provider Note   CSN: [id number] Arrival date & time: 12/15/21  1824     History  Chief Complaint  Patient presents with   Drug Overdose    Julian Clarke is a 36 y.o. male PMH depression, cocaine abuse, suicidal behavior who is presenting with drug overdose.  History obtained from EMS.  They report that the patient was smoking crack cocaine earlier today, when he became altered, staggering around, and had a ground-level fall.  Patient was given Narcan by bystander, unknown amount, although EMS believes that the patient received 3 to 5 mg of intranasal Narcan.  Patient was placed on nonrebreather by EMS for periods of apnea.  Home Medications Prior to Admission medications   Medication Sig Start Date End Date Taking? Authorizing Provider  escitalopram (LEXAPRO) 10 MG tablet Take 1 tablet (10 mg total) by mouth daily. For depression 09/17/20   Armandina Stammer I, NP  gabapentin (NEURONTIN) 100 MG capsule Take 1 capsule (100 mg total) by mouth 3 (three) times daily. For agitation 09/16/20   Armandina Stammer I, NP  hydrOXYzine (ATARAX/VISTARIL) 25 MG tablet Take 1 tablet (25 mg total) by mouth every 6 (six) hours as needed (anxiety). 09/16/20   Armandina Stammer I, NP  nicotine (NICODERM CQ - DOSED IN MG/24 HOURS) 21 mg/24hr patch Place 1 patch (21 mg total) onto the skin daily. (May buy from over the counter): For smoking cessation 09/17/20   Armandina Stammer I, NP  traZODone (DESYREL) 50 MG tablet Take 1 tablet (50 mg total) by mouth at bedtime as needed for sleep. 09/16/20   Sanjuana Kava, NP      Allergies    Patient has no known allergies.    Review of Systems   Review of Systems As in HPI.  Physical Exam Updated Vital Signs BP (!) 144/103   Resp (!) 26   Physical Exam Vitals and nursing note reviewed.  Constitutional:      General: He is not in acute distress.    Appearance: He is well-developed.     Comments: Patient laying in bed with  non-rebreather in place. Intermittent periods of apnea, resolved with tactile stimulation.  HENT:     Head: Normocephalic and atraumatic.     Right Ear: External ear normal.     Left Ear: External ear normal.     Nose: Nose normal.  Eyes:     Comments: Pinpoint pupils.  Neck:     Comments: Cervical collar in place. Cardiovascular:     Rate and Rhythm: Normal rate and regular rhythm.     Pulses: Normal pulses.     Heart sounds: No murmur heard. Pulmonary:     Effort: Pulmonary effort is normal. No respiratory distress.     Breath sounds: Normal breath sounds. No wheezing.  Abdominal:     Palpations: Abdomen is soft.     Tenderness: There is no abdominal tenderness.  Musculoskeletal:     Cervical back: Neck supple.  Skin:    General: Skin is warm and dry.     ED Results / Procedures / Treatments   Labs (all labs ordered are listed, but only abnormal results are displayed) Labs Reviewed - No data to display  EKG None  Radiology No results found.  Procedures Procedures    Medications Ordered in ED Medications  naloxone (NARCAN) 0.4 MG/ML injection (has no administration in time range)  naloxone [facility]) injection 0.4 mg (has no administration in time range)  naloxone [facility]) injection 0.4 mg (0.4 mg Intravenous Given 12/15/21 1837)    ED Course/ Medical Decision Making/ A&P                           Medical Decision Making Amount and/or Complexity of Data Reviewed Labs: ordered. Radiology: ordered. ECG/medicine tests: ordered.  Risk Prescription drug management. Decision regarding hospitalization.   Julian Clarke is a 36 y.o. male with significant PMHx cocaine abuse, suicidal behavior who presented to the ED with drug overdose and ground-level fall.  Vitals at presentation within normal limits.  Patient is hemodynamically stable, afebrile, intermittently apneic with nonrebreather in place.  Physical exam demonstrates normal heart sounds.  Lungs clear to  auscultation bilaterally.  Abdomen is soft, nontender to palpation.  No pitting edema.  Patient given 0.4 mg IV Narcan x 2 with improvement in his respiratory rate.  Lab work obtained, resulted notable for CBC within normal limits.  CMP with creatinine 1.31, up from previous lab work, but not consistently elevated from baseline.  No elevated LFTs or anion gap.  Ethanol, salicylate, Tylenol level negative.  UDS positive for cocaine.    Imaging (CT head, CT cervical spine, plain films of chest and pelvis) was pursued, notable for CT head without evidence of acute intracranial abnormality.  CT cervical spine without acute fracture, but with imaged left apical groundglass opacities, consistent with either infectious or inflammatory process in the lungs.  Chest x-ray unremarkable.  Pelvis no fracture or dislocation.  EKG obtained, demonstrates sinus rhythm, ventricular rate 72 bpm.  No evidence of acute ischemia.  Interpreted by myself and my attending.  Interventions include Narcan 0.4 mg IV x 3 in total, 1 L normal saline bolus  Patient's respirations improved with Narcan.  However, mental status has not returned to baseline.  Patient has required repeat dosing of Narcan x 3, and I believe he will benefit from inpatient admission for additional monitoring and evaluation.  Patient accepted for admission by hospitalist.  The plan for this patient was discussed with Dr. Silverio Lay, who voiced agreement and who oversaw evaluation and treatment of this patient.    Final Clinical Impression(s) / ED Diagnoses Final diagnoses:  Overdose of undetermined intent, initial encounter    Rx / DC Orders ED Discharge Orders     None         Skeet Simmer, MD 12/15/21 2346    Charlynne Pander, MD 12/16/21 [phone]

## 2021-12-15 NOTE — ED Notes (Signed)
Pt's girlfriend updates with pt's permission

## 2021-12-15 NOTE — ED Notes (Signed)
Pt continues to breath spontaneously and still lethargic

## 2021-12-15 NOTE — ED Triage Notes (Signed)
Pt BIB EMS from the [facility] for an over dose. Pt admits to smoking crack cocaine. Narcan given by bystanders unknown amount and 1mg  narcan given by EMS . PT has period of apnea but is alert and oriented x4. Pinpoint pupils   136/86  C collar 100 ST

## 2021-12-16 ENCOUNTER — Encounter (HOSPITAL_COMMUNITY): Payer: Self-pay | Admitting: Internal Medicine

## 2021-12-16 ENCOUNTER — Other Ambulatory Visit: Payer: Self-pay

## 2021-12-16 DIAGNOSIS — F1410 Cocaine abuse, uncomplicated: Secondary | ICD-10-CM

## 2021-12-16 DIAGNOSIS — G9340 Encephalopathy, unspecified: Principal | ICD-10-CM

## 2021-12-16 LAB — I-STAT ARTERIAL BLOOD GAS, ED
Acid-base deficit: 2 mmol/L (ref 0.0–2.0)
Bicarbonate: 24.8 mmol/L (ref 20.0–28.0)
Calcium, Ion: 1.32 mmol/L (ref 1.15–1.40)
HCT: 46 % (ref 39.0–52.0)
Hemoglobin: 15.6 g/dL (ref 13.0–17.0)
O2 Saturation: 88 %
Patient temperature: 98
Potassium: 4.3 mmol/L (ref 3.5–5.1)
Sodium: 137 mmol/L (ref 135–145)
TCO2: 26 mmol/L (ref 22–32)
pCO2 arterial: 46.5 mmHg (ref 32–48)
pH, Arterial: 7.334 — ABNORMAL LOW (ref 7.35–7.45)
pO2, Arterial: 58 mmHg — ABNORMAL LOW (ref 83–108)

## 2021-12-16 LAB — HIV ANTIBODY (ROUTINE TESTING W REFLEX): HIV Screen 4th Generation wRfx: NONREACTIVE

## 2021-12-16 LAB — BASIC METABOLIC PANEL
Anion gap: 7 (ref 5–15)
BUN: 17 mg/dL (ref 6–20)
CO2: 24 mmol/L (ref 22–32)
Calcium: 9.2 mg/dL (ref 8.9–10.3)
Chloride: 104 mmol/L (ref 98–111)
Creatinine, Ser: 0.93 mg/dL (ref 0.61–1.24)
GFR, Estimated: >60 mL/min (ref >60)
Glucose, Bld: 88 mg/dL (ref 70–99)
Potassium: 4.2 mmol/L (ref 3.5–5.1)
Sodium: 135 mmol/L (ref 135–145)

## 2021-12-16 LAB — CK: Total CK: 144 U/L (ref 49–397)

## 2021-12-16 LAB — CBC
HCT: 48.2 % (ref 39.0–52.0)
HCT: 47.8 % (ref 39.0–52.0)
Hemoglobin: 15.3 g/dL (ref 13.0–17.0)
Hemoglobin: 15.9 g/dL (ref 13.0–17.0)
MCH: 32.3 pg (ref 26.0–34.0)
MCH: 32.3 pg (ref 26.0–34.0)
MCHC: 31.7 g/dL (ref 30.0–36.0)
MCHC: 33.3 g/dL (ref 30.0–36.0)
MCV: 101.7 fL — ABNORMAL HIGH (ref 80.0–100.0)
MCV: 97.2 fL (ref 80.0–100.0)
Platelets: 197 10*3/uL (ref 150–400)
Platelets: 199 10*3/uL (ref 150–400)
RBC: 4.74 MIL/uL (ref 4.22–5.81)
RBC: 4.92 MIL/uL (ref 4.22–5.81)
RDW: 11.7 % (ref 11.5–15.5)
RDW: 11.7 % (ref 11.5–15.5)
WBC: 11.8 10*3/uL — ABNORMAL HIGH (ref 4.0–10.5)
WBC: 9.8 10*3/uL (ref 4.0–10.5)
nRBC: 0 % (ref 0.0–0.2)
nRBC: 0 % (ref 0.0–0.2)

## 2021-12-16 LAB — CBG MONITORING, ED
Glucose-Capillary: 98 mg/dL (ref 70–99)
Glucose-Capillary: 90 mg/dL (ref 70–99)
Glucose-Capillary: 98 mg/dL (ref 70–99)

## 2021-12-16 LAB — CREATININE, SERUM
Creatinine, Ser: 0.95 mg/dL (ref 0.61–1.24)
GFR, Estimated: >60 mL/min (ref >60)

## 2021-12-16 LAB — TROPONIN I (HIGH SENSITIVITY)
Troponin I (High Sensitivity): 12 ng/L (ref <18)
Troponin I (High Sensitivity): 15 ng/L (ref <18)

## 2021-12-16 MED ORDER — LACTATED RINGERS IV SOLN: INTRAVENOUS | Status: DC

## 2021-12-16 MED ORDER — ENOXAPARIN SODIUM 40 MG/0.4ML IJ SOSY
40.0000 mg | PREFILLED_SYRINGE | Freq: Every day | INTRAMUSCULAR | Status: DC
  Administered 2021-12-16: 40 mg via SUBCUTANEOUS
  Filled 2021-12-16: qty 0.4

## 2021-12-16 NOTE — Discharge Summary (Signed)
Physician Discharge Summary  Julian Clarke MCN:[id number] DOB: [birth year] DOA: 12/15/2021  PCP: Patient, No Pcp Per  Admit date: 12/15/2021 Discharge date: 12/16/2021  Admitted From: Home Disposition: Home  Recommendations for Outpatient Follow-up:  Follow up with PCP in 1-2 weeks  Discharge Condition: Stable CODE STATUS: Full Diet recommendation: As tolerated  Brief/Interim Summary: Julian Clarke is a 36 y.o. male with EMS was called from the motel for patient being encephalopathic after smoking crack cocaine.  Not much history is obtained because patient is encephalopathic.  There is no family available for further information.  Patient has prior history of suicidal ideation.  Was given Narcan on the way.  No acute issues or events overnight, admitted for mental status changes concerning for overdose, patient awake alert oriented today denies any suicidal ideation or attempt.  He states that he was given something other than crack cocaine which was the cause of the overdose, at this time he is otherwise stable and agreeable for discharge home.  Discharge Diagnoses:  Principal Problem:   Acute encephalopathy Active Problems:   Cocaine abuse (HCC)    Discharge Instructions   Allergies as of 12/16/2021   No Known Allergies      Medication List     STOP taking these medications    escitalopram 10 MG tablet Commonly known as: LEXAPRO        No Known Allergies  Consultations: None  Procedures/Studies: CT HEAD WO CONTRAST ( )  Result Date: 12/15/2021 CLINICAL DATA:  Overdose EXAM: CT HEAD WITHOUT CONTRAST CT CERVICAL SPINE WITHOUT CONTRAST TECHNIQUE: Multidetector CT imaging of the head and cervical spine was performed following the standard protocol without intravenous contrast. Multiplanar CT image reconstructions of the cervical spine were also generated. RADIATION DOSE REDUCTION: This exam was performed according to the departmental dose-optimization program  which includes automated exposure control, adjustment of the mA and/or kV according to patient size and/or use of iterative reconstruction technique. COMPARISON:  06/16/2005 head and cervical spine CT FINDINGS: CT HEAD FINDINGS Brain: No evidence of acute infarct, hemorrhage, mass, mass effect, or midline shift. No hydrocephalus or extra-axial fluid collection. Gray-white differentiation is preserved. Vascular: No hyperdense vessel. Skull: Normal. Negative for fracture or focal lesion. Sinuses/Orbits: Complete opacification of the left sphenoid sinus and partial opacification of the posterior left ethmoid air cells. Mucosal thickening in the inferior right maxillary sinus. The orbits are unremarkable. Other: The mastoid air cells are well aerated. CT CERVICAL SPINE FINDINGS Evaluation is somewhat limited by motion artifact. Alignment: No listhesis. Skull base and vertebrae: No acute fracture. No primary bone lesion or focal pathologic process. Soft tissues and spinal canal: No prevertebral fluid or swelling. No visible canal hematoma. Disc levels:  No high-grade spinal canal stenosis. Upper chest: Nodular and ground-glass opacities in the imaged left apex (series 4, image 91). Other: None. IMPRESSION: 1.  No acute intracranial process. 2.  No acute fracture or traumatic listhesis in the cervical spine. 3. Nodular and ground-glass opacities in the imaged left apex which are nonspecific but could be infectious or inflammatory. Consider CT chest for further evaluation, if clinically indicated. Electronically Signed   By: Wiliam Ke M.D.   On: 12/15/2021 20:08   CT Cervical Spine Wo Contrast  Result Date: 12/15/2021 CLINICAL DATA:  Overdose EXAM: CT HEAD WITHOUT CONTRAST CT CERVICAL SPINE WITHOUT CONTRAST TECHNIQUE: Multidetector CT imaging of the head and cervical spine was performed following the standard protocol without intravenous contrast. Multiplanar CT image reconstructions of the cervical spine were also  generated. RADIATION DOSE REDUCTION: This exam was performed according to the departmental dose-optimization program which includes automated exposure control, adjustment of the mA and/or kV according to patient size and/or use of iterative reconstruction technique. COMPARISON:  06/16/2005 head and cervical spine CT FINDINGS: CT HEAD FINDINGS Brain: No evidence of acute infarct, hemorrhage, mass, mass effect, or midline shift. No hydrocephalus or extra-axial fluid collection. Gray-white differentiation is preserved. Vascular: No hyperdense vessel. Skull: Normal. Negative for fracture or focal lesion. Sinuses/Orbits: Complete opacification of the left sphenoid sinus and partial opacification of the posterior left ethmoid air cells. Mucosal thickening in the inferior right maxillary sinus. The orbits are unremarkable. Other: The mastoid air cells are well aerated. CT CERVICAL SPINE FINDINGS Evaluation is somewhat limited by motion artifact. Alignment: No listhesis. Skull base and vertebrae: No acute fracture. No primary bone lesion or focal pathologic process. Soft tissues and spinal canal: No prevertebral fluid or swelling. No visible canal hematoma. Disc levels:  No high-grade spinal canal stenosis. Upper chest: Nodular and ground-glass opacities in the imaged left apex (series 4, image 91). Other: None. IMPRESSION: 1.  No acute intracranial process. 2.  No acute fracture or traumatic listhesis in the cervical spine. 3. Nodular and ground-glass opacities in the imaged left apex which are nonspecific but could be infectious or inflammatory. Consider CT chest for further evaluation, if clinically indicated. Electronically Signed   By: Wiliam Ke M.D.   On: 12/15/2021 20:08   DG Pelvis Portable  Result Date: 12/15/2021 CLINICAL DATA:  Trauma, fall EXAM: PORTABLE PELVIS 1-2 VIEWS COMPARISON:  None Available. FINDINGS: No fracture or dislocation is seen.  SI joints are symmetrical. IMPRESSION: No fracture or  dislocation is seen. Electronically Signed   By: Ernie Avena M.D.   On: 12/15/2021 19:02   DG Chest Port 1 View  Result Date: 12/15/2021 CLINICAL DATA:  Trauma, fall EXAM: PORTABLE CHEST 1 VIEW COMPARISON:  None Available. FINDINGS: Transverse diameter of the heart is slightly increased. There are no signs of pulmonary edema or focal pulmonary consolidation. There is no significant pleural effusion or pneumothorax. IMPRESSION: There are no signs of pulmonary edema or focal pulmonary consolidation. Electronically Signed   By: Ernie Avena M.D.   On: 12/15/2021 19:01     Subjective: No acute issues or events overnight   Discharge Exam: Vitals:   12/16/21 1254 12/16/21 1500  BP: 139/86 128/79  Pulse: 76 (!) 50  Resp: 18 16  Temp: 98.1 F (36.7 C)   SpO2: 100% 100%   Vitals:   12/16/21 0900 12/16/21 1019 12/16/21 1254 12/16/21 1500  BP: 134/83  139/86 128/79  Pulse: 84  76 (!) 50  Resp: (!) 21  18 16   Temp:  98.5 F (36.9 C) 98.1 F (36.7 C)   TempSrc:  Oral Oral   SpO2: 100%  100% 100%  Weight:      Height:        General: Pt is alert, awake, not in acute distress Cardiovascular: RRR, S1/S2 +, no rubs, no gallops Respiratory: CTA bilaterally, no wheezing, no rhonchi Abdominal: Soft, NT, ND, bowel sounds + Extremities: no edema, no cyanosis    The results of significant diagnostics from this hospitalization (including imaging, microbiology, ancillary and laboratory) are listed below for reference.     Microbiology: No results found for this or any previous visit (from the past 240 hour(s)).   Labs: BNP (last 3 results) No results for input(s): "BNP" in the last 8760 hours. Basic Metabolic Panel:  Recent Labs  Lab 12/15/21 1846 12/16/21 0046 12/16/21 0245 12/16/21 0427  NA 138 137  --  135  K 3.8 4.3  --  4.2  CL 106  --   --  104  CO2 24  --   --  24  GLUCOSE 133*  --   --  88  BUN 22*  --   --  17  CREATININE 1.31*  --  0.95 0.93  CALCIUM  9.3  --   --  9.2   Liver Function Tests: Recent Labs  Lab 12/15/21 1846  AST 22  ALT 16  ALKPHOS 31*  BILITOT 0.5  PROT 6.9  ALBUMIN 4.0   No results for input(s): "LIPASE", "AMYLASE" in the last 168 hours. No results for input(s): "AMMONIA" in the last 168 hours. CBC: Recent Labs  Lab 12/15/21 1846 12/16/21 0046 12/16/21 0245 12/16/21 0427  WBC 7.2  --  11.8* 9.8  NEUTROABS 5.7  --   --   --   HGB 15.6 15.6 15.3 15.9  HCT 48.3 46.0 48.2 47.8  MCV 98.4  --  101.7* 97.2  PLT 216  --  197 199   Cardiac Enzymes: Recent Labs  Lab 12/16/21 0245  CKTOTAL 144   BNP: Invalid input(s): "POCBNP" CBG: Recent Labs  Lab 12/16/21 0244 12/16/21 0609 12/16/21 1143  GLUCAP 98 90 98   D-Dimer No results for input(s): "DDIMER" in the last 72 hours. Hgb A1c No results for input(s): "HGBA1C" in the last 72 hours. Lipid Profile No results for input(s): "CHOL", "HDL", "LDLCALC", "TRIG", "CHOLHDL", "LDLDIRECT" in the last 72 hours. Thyroid function studies No results for input(s): "TSH", "T4TOTAL", "T3FREE", "THYROIDAB" in the last 72 hours.  Invalid input(s): "FREET3" Anemia work up No results for input(s): "VITAMINB12", "FOLATE", "FERRITIN", "TIBC", "IRON", "RETICCTPCT" in the last 72 hours. Urinalysis No results found for: "COLORURINE", "APPEARANCEUR", "LABSPEC", "PHURINE", "GLUCOSEU", "HGBUR", "BILIRUBINUR", "KETONESUR", "PROTEINUR", "UROBILINOGEN", "NITRITE", "LEUKOCYTESUR" Sepsis Labs Recent Labs  Lab 12/15/21 1846 12/16/21 0245 12/16/21 0427  WBC 7.2 11.8* 9.8   Microbiology No results found for this or any previous visit (from the past 240 hour(s)).   Time coordinating discharge: Over 30 minutes  SIGNED:   Azucena Fallen, DO [facility] 12/16/2021, 4:01 PM Pager   If 7PM-7AM, please contact night-coverage [email or web address]

## 2021-12-16 NOTE — H&P (Signed)
History and Physical    Julian Clarke NIO:[id number] DOB: Sep 21, 1985 DOA: 12/15/2021  PCP: Patient, No Pcp Per  Patient coming from: Patient was brought in from motel.  Chief Complaint: Drug overdose.  HPI: Julian Clarke is a 36 y.o. male with EMS was called from the motel for patient being encephalopathic after smoking crack cocaine.  Not much history is obtained because patient is encephalopathic.  There is no family available for further information.  Patient has prior history of suicidal ideation.  Was given Narcan on the way.  ED Course: In the ER patient had labs drawn which shows mildly elevated creatinine urine drug screen positive for cocaine patient on exam he is still confused he was given multiple dose of Narcan still confused but able to maintain saturation and protecting airway.  Easily arousable.  ABG shows pH of 7.33 pCO2 of 46.5.  Tylenol levels and salicylate levels were negative.  CT head and C-spine unremarkable but did show some nodular densities in the left apex of the lung for which they have requested CT chest.  Review of Systems: As per HPI, rest all negative.   Past Medical History:  Diagnosis Date   Anxiety    Depression    Hypertension     History reviewed. No pertinent surgical history.   reports that he has been smoking cigarettes. He has never used smokeless tobacco. He reports current alcohol use of about 1.0 standard drink of alcohol per week. He reports current drug use. Frequency: 5.00 times per week. Drugs: Marijuana, Cocaine, and "Crack" cocaine.  No Known Allergies  Family History  Family history unknown: Yes    Prior to Admission medications   Medication Sig Start Date End Date Taking? Authorizing Provider  escitalopram (LEXAPRO) 10 MG tablet Take 1 tablet (10 mg total) by mouth daily. For depression 09/17/20   Armandina Stammer I, NP  gabapentin (NEURONTIN) 100 MG capsule Take 1 capsule (100 mg total) by mouth 3 (three) times daily. For  agitation 09/16/20   Armandina Stammer I, NP  hydrOXYzine (ATARAX/VISTARIL) 25 MG tablet Take 1 tablet (25 mg total) by mouth every 6 (six) hours as needed (anxiety). 09/16/20   Armandina Stammer I, NP  nicotine (NICODERM CQ - DOSED IN MG/24 HOURS) 21 mg/24hr patch Place 1 patch (21 mg total) onto the skin daily. (May buy from over the counter): For smoking cessation 09/17/20   Armandina Stammer I, NP  traZODone (DESYREL) 50 MG tablet Take 1 tablet (50 mg total) by mouth at bedtime as needed for sleep. 09/16/20   Sanjuana Kava, NP    Physical Exam: Constitutional: Moderately built and nourished. Vitals:   12/16/21 0030 12/16/21 0100 12/16/21 0130 12/16/21 0201  BP: (!) 133/91 131/87 134/89   Pulse: 66  70   Resp: 13 13 14    Temp:      TempSrc:      SpO2: 99%  97%   Weight:    81.6 kg  Height:    5\' 6"  (1.676 m)   Eyes: Anicteric no pallor. ENMT: No discharge from the ears eyes nose and mouth. Neck: No mass felt.  No neck rigidity. Respiratory: No rhonchi or crepitations. Cardiovascular: S1-S2 heard. Abdomen: Soft nontender bowel sound present. Musculoskeletal: No edema. Skin: No rash. Neurologic: Patient is lethargic but easily arousable pupils equal reacting to light. Psychiatric: Patient is lethargic.   Labs on Admission: I have personally reviewed following labs and imaging studies  CBC: Recent Labs  Lab 12/15/21 1846 12/16/21 0046  WBC 7.2  --   NEUTROABS 5.7  --   HGB 15.6 15.6  HCT 48.3 46.0  MCV 98.4  --   PLT 216  --    Basic Metabolic Panel: Recent Labs  Lab 12/15/21 1846 12/16/21 0046  NA 138 137  K 3.8 4.3  CL 106  --   CO2 24  --   GLUCOSE 133*  --   BUN 22*  --   CREATININE 1.31*  --   CALCIUM 9.3  --    GFR: Estimated Creatinine Clearance: 78.2 mL/min (A) (by C-G formula based on SCr of 1.31 mg/dL (H)). Liver Function Tests: Recent Labs  Lab 12/15/21 1846  AST 22  ALT 16  ALKPHOS 31*  BILITOT 0.5  PROT 6.9  ALBUMIN 4.0   No results for input(s):  "LIPASE", "AMYLASE" in the last 168 hours. No results for input(s): "AMMONIA" in the last 168 hours. Coagulation Profile: No results for input(s): "INR", "PROTIME" in the last 168 hours. Cardiac Enzymes: No results for input(s): "CKTOTAL", "CKMB", "CKMBINDEX", "TROPONINI" in the last 168 hours. BNP (last 3 results) No results for input(s): "PROBNP" in the last 8760 hours. HbA1C: No results for input(s): "HGBA1C" in the last 72 hours. CBG: No results for input(s): "GLUCAP" in the last 168 hours. Lipid Profile: No results for input(s): "CHOL", "HDL", "LDLCALC", "TRIG", "CHOLHDL", "LDLDIRECT" in the last 72 hours. Thyroid Function Tests: No results for input(s): "TSH", "T4TOTAL", "FREET4", "T3FREE", "THYROIDAB" in the last 72 hours. Anemia Panel: No results for input(s): "VITAMINB12", "FOLATE", "FERRITIN", "TIBC", "IRON", "RETICCTPCT" in the last 72 hours. Urine analysis: No results found for: "COLORURINE", "APPEARANCEUR", "LABSPEC", "PHURINE", "GLUCOSEU", "HGBUR", "BILIRUBINUR", "KETONESUR", "PROTEINUR", "UROBILINOGEN", "NITRITE", "LEUKOCYTESUR" Sepsis Labs: @LABRCNTIP (procalcitonin:4,lacticidven:4) )No results found for this or any previous visit (from the past 240 hour(s)).   Radiological Exams on Admission: CT HEAD WO CONTRAST ( )  Result Date: 12/15/2021 CLINICAL DATA:  Overdose EXAM: CT HEAD WITHOUT CONTRAST CT CERVICAL SPINE WITHOUT CONTRAST TECHNIQUE: Multidetector CT imaging of the head and cervical spine was performed following the standard protocol without intravenous contrast. Multiplanar CT image reconstructions of the cervical spine were also generated. RADIATION DOSE REDUCTION: This exam was performed according to the departmental dose-optimization program which includes automated exposure control, adjustment of the mA and/or kV according to patient size and/or use of iterative reconstruction technique. COMPARISON:  06/16/2005 head and cervical spine CT FINDINGS: CT HEAD  FINDINGS Brain: No evidence of acute infarct, hemorrhage, mass, mass effect, or midline shift. No hydrocephalus or extra-axial fluid collection. Gray-white differentiation is preserved. Vascular: No hyperdense vessel. Skull: Normal. Negative for fracture or focal lesion. Sinuses/Orbits: Complete opacification of the left sphenoid sinus and partial opacification of the posterior left ethmoid air cells. Mucosal thickening in the inferior right maxillary sinus. The orbits are unremarkable. Other: The mastoid air cells are well aerated. CT CERVICAL SPINE FINDINGS Evaluation is somewhat limited by motion artifact. Alignment: No listhesis. Skull base and vertebrae: No acute fracture. No primary bone lesion or focal pathologic process. Soft tissues and spinal canal: No prevertebral fluid or swelling. No visible canal hematoma. Disc levels:  No high-grade spinal canal stenosis. Upper chest: Nodular and ground-glass opacities in the imaged left apex (series 4, image 91). Other: None. IMPRESSION: 1.  No acute intracranial process. 2.  No acute fracture or traumatic listhesis in the cervical spine. 3. Nodular and ground-glass opacities in the imaged left apex which are nonspecific but could be infectious or inflammatory. Consider CT chest for further evaluation, if  clinically indicated. Electronically Signed   By: Wiliam Ke M.D.   On: 12/15/2021 20:08   CT Cervical Spine Wo Contrast  Result Date: 12/15/2021 CLINICAL DATA:  Overdose EXAM: CT HEAD WITHOUT CONTRAST CT CERVICAL SPINE WITHOUT CONTRAST TECHNIQUE: Multidetector CT imaging of the head and cervical spine was performed following the standard protocol without intravenous contrast. Multiplanar CT image reconstructions of the cervical spine were also generated. RADIATION DOSE REDUCTION: This exam was performed according to the departmental dose-optimization program which includes automated exposure control, adjustment of the mA and/or kV according to patient size  and/or use of iterative reconstruction technique. COMPARISON:  06/16/2005 head and cervical spine CT FINDINGS: CT HEAD FINDINGS Brain: No evidence of acute infarct, hemorrhage, mass, mass effect, or midline shift. No hydrocephalus or extra-axial fluid collection. Gray-white differentiation is preserved. Vascular: No hyperdense vessel. Skull: Normal. Negative for fracture or focal lesion. Sinuses/Orbits: Complete opacification of the left sphenoid sinus and partial opacification of the posterior left ethmoid air cells. Mucosal thickening in the inferior right maxillary sinus. The orbits are unremarkable. Other: The mastoid air cells are well aerated. CT CERVICAL SPINE FINDINGS Evaluation is somewhat limited by motion artifact. Alignment: No listhesis. Skull base and vertebrae: No acute fracture. No primary bone lesion or focal pathologic process. Soft tissues and spinal canal: No prevertebral fluid or swelling. No visible canal hematoma. Disc levels:  No high-grade spinal canal stenosis. Upper chest: Nodular and ground-glass opacities in the imaged left apex (series 4, image 91). Other: None. IMPRESSION: 1.  No acute intracranial process. 2.  No acute fracture or traumatic listhesis in the cervical spine. 3. Nodular and ground-glass opacities in the imaged left apex which are nonspecific but could be infectious or inflammatory. Consider CT chest for further evaluation, if clinically indicated. Electronically Signed   By: Wiliam Ke M.D.   On: 12/15/2021 20:08   DG Pelvis Portable  Result Date: 12/15/2021 CLINICAL DATA:  Trauma, fall EXAM: PORTABLE PELVIS 1-2 VIEWS COMPARISON:  None Available. FINDINGS: No fracture or dislocation is seen.  SI joints are symmetrical. IMPRESSION: No fracture or dislocation is seen. Electronically Signed   By: Ernie Avena M.D.   On: 12/15/2021 19:02   DG Chest Port 1 View  Result Date: 12/15/2021 CLINICAL DATA:  Trauma, fall EXAM: PORTABLE CHEST 1 VIEW COMPARISON:   None Available. FINDINGS: Transverse diameter of the heart is slightly increased. There are no signs of pulmonary edema or focal pulmonary consolidation. There is no significant pleural effusion or pneumothorax. IMPRESSION: There are no signs of pulmonary edema or focal pulmonary consolidation. Electronically Signed   By: Ernie Avena M.D.   On: 12/15/2021 19:01    EKG: Independently reviewed.  Poor tracing will repeat EKG.  Assessment/Plan Principal Problem:   Acute encephalopathy Active Problems:   Cocaine abuse (HCC)    Acute encephalopathy likely from drug overdose patient has been smoking crack cocaine as per the report.  Will keep patient n.p.o. and gently hydrate closely monitor respiratory status. Prior history of depression will keep patient on suicide precaution until you get to get further history from patient or his bystanders elevators when available. Acute renal failure likely from dehydration will gently hydrate follow metabolic panel check UA. Abnormal density seen in the left apex on the CAT scan of the neck.  Will get a dedicated CT chest. Neck abuse will need counseling.   DVT prophylaxis: SCDs. Code Status: Full code. Family Communication: Unable to reach family. Disposition Plan: To be determined. Consults  called: None. Admission status: Observation.   Eduard Clos MD [facility] Pager [phone].  If 7PM-7AM, please contact night-coverage [email or web address] Password [email or web address]  12/16/2021, 2:06 AM

## 2021-12-16 NOTE — ED Notes (Signed)
Pt remains lethargic but is able to be arousable and able to answer orientation questions. Spontaneous breathing noted with spo2 98-100%.

## 2021-12-16 NOTE — ED Notes (Signed)
Reviewed discharge instructions with patient. Follow-up care reviewed. Patient verbalized understanding. Patient A&Ox4, VSS, and ambulatory with steady gait upon discharge.  

## 2021-12-16 NOTE — ED Notes (Signed)
Please have patient call his mother
